# Patient Record
Sex: Female | Born: 1975 | Race: Black or African American | Hispanic: No | Marital: Single | Smoking: Current some day smoker
Health system: Southern US, Community
[De-identification: ages and names within clinical notes are randomized; demographics above are authoritative.]

## PROBLEM LIST (undated history)

## (undated) DIAGNOSIS — A539 Syphilis, unspecified: Secondary | ICD-10-CM

## (undated) DIAGNOSIS — I10 Essential (primary) hypertension: Secondary | ICD-10-CM

## (undated) DIAGNOSIS — F1410 Cocaine abuse, uncomplicated: Secondary | ICD-10-CM

## (undated) DIAGNOSIS — B182 Chronic viral hepatitis C: Secondary | ICD-10-CM

## (undated) HISTORY — PX: APPENDECTOMY: SHX54

## (undated) HISTORY — PX: TUBAL LIGATION: SHX77

## (undated) HISTORY — PX: RECTAL SURGERY: SHX760

---

## 2011-12-15 ENCOUNTER — Emergency Department (HOSPITAL_COMMUNITY): Payer: Medicaid Other | Admitting: Anesthesiology

## 2011-12-15 ENCOUNTER — Emergency Department (INDEPENDENT_AMBULATORY_CARE_PROVIDER_SITE_OTHER): Payer: Medicaid Other

## 2011-12-15 ENCOUNTER — Inpatient Hospital Stay (HOSPITAL_COMMUNITY)
Admission: EM | Admit: 2011-12-15 | Discharge: 2011-12-19 | DRG: 349 | Disposition: A | Discharge status: Home / Self Care | Payer: Medicaid Other | Attending: General Surgery | Admitting: General Surgery

## 2011-12-15 ENCOUNTER — Encounter (HOSPITAL_COMMUNITY): Payer: Self-pay | Admitting: Anesthesiology

## 2011-12-15 ENCOUNTER — Encounter (HOSPITAL_BASED_OUTPATIENT_CLINIC_OR_DEPARTMENT_OTHER): Payer: Self-pay | Admitting: *Deleted

## 2011-12-15 ENCOUNTER — Emergency Department (HOSPITAL_BASED_OUTPATIENT_CLINIC_OR_DEPARTMENT_OTHER)
Admission: EM | Admit: 2011-12-15 | Discharge: 2011-12-15 | Disposition: A | Discharge status: Other Acute Inpatient Hospital | Payer: Medicaid Other | Source: Home / Self Care | Attending: Emergency Medicine | Admitting: Emergency Medicine

## 2011-12-15 ENCOUNTER — Encounter (HOSPITAL_COMMUNITY): Admission: EM | Disposition: A | Discharge status: Home / Self Care | Payer: Self-pay | Source: Home / Self Care

## 2011-12-15 DIAGNOSIS — B182 Chronic viral hepatitis C: Secondary | ICD-10-CM | POA: Insufficient documentation

## 2011-12-15 DIAGNOSIS — Z888 Allergy status to other drugs, medicaments and biological substances status: Secondary | ICD-10-CM

## 2011-12-15 DIAGNOSIS — K611 Rectal abscess: Secondary | ICD-10-CM

## 2011-12-15 DIAGNOSIS — K612 Anorectal abscess: Secondary | ICD-10-CM

## 2011-12-15 DIAGNOSIS — K6289 Other specified diseases of anus and rectum: Secondary | ICD-10-CM

## 2011-12-15 DIAGNOSIS — I10 Essential (primary) hypertension: Secondary | ICD-10-CM | POA: Diagnosis present

## 2011-12-15 HISTORY — PX: EXAMINATION UNDER ANESTHESIA: SHX1540

## 2011-12-15 HISTORY — DX: Essential (primary) hypertension: I10

## 2011-12-15 HISTORY — DX: Chronic viral hepatitis C: B18.2

## 2011-12-15 LAB — CBC
Hemoglobin: 14.5 g/dL (ref 12.0–15.0)
MCH: 34.1 pg — ABNORMAL HIGH (ref 26.0–34.0)
MCHC: 34.9 g/dL (ref 30.0–36.0)
RDW: 13 % (ref 11.5–15.5)

## 2011-12-15 LAB — APTT: aPTT: 29 seconds (ref 24–37)

## 2011-12-15 LAB — COMPREHENSIVE METABOLIC PANEL
Albumin: 3.3 g/dL — ABNORMAL LOW (ref 3.5–5.2)
BUN: 10 mg/dL (ref 6–23)
Creatinine, Ser: 0.7 mg/dL (ref 0.50–1.10)
Potassium: 4 mEq/L (ref 3.5–5.1)
Total Protein: 7.1 g/dL (ref 6.0–8.3)

## 2011-12-15 LAB — DIFFERENTIAL
Basophils Relative: 0 % (ref 0–1)
Eosinophils Absolute: 0.1 10*3/uL (ref 0.0–0.7)
Monocytes Absolute: 1 10*3/uL (ref 0.1–1.0)
Monocytes Relative: 8 % (ref 3–12)
Neutrophils Relative %: 72 % (ref 43–77)

## 2011-12-15 LAB — PROTIME-INR: Prothrombin Time: 12.7 seconds (ref 11.6–15.2)

## 2011-12-15 LAB — PREGNANCY, URINE: Preg Test, Ur: NEGATIVE

## 2011-12-15 SURGERY — EXAM UNDER ANESTHESIA
Anesthesia: General | Site: Rectum | Wound class: Dirty or Infected

## 2011-12-15 MED ORDER — SODIUM CHLORIDE 0.9 % IV SOLN
INTRAVENOUS | Status: DC | PRN
  Administered 2011-12-15: 23:00:00 via INTRAVENOUS

## 2011-12-15 MED ORDER — SUCCINYLCHOLINE CHLORIDE 20 MG/ML IJ SOLN
INTRAMUSCULAR | Status: DC | PRN
  Administered 2011-12-15: 100 mg via INTRAVENOUS

## 2011-12-15 MED ORDER — CIPROFLOXACIN IN D5W 400 MG/200ML IV SOLN
400.0000 mg | Freq: Once | INTRAVENOUS | Status: DC
  Administered 2011-12-15: 400 mg via INTRAVENOUS
  Filled 2011-12-15: qty 200

## 2011-12-15 MED ORDER — BUPIVACAINE HCL (PF) 0.25 % IJ SOLN
INTRAMUSCULAR | Status: AC
  Filled 2011-12-15: qty 30

## 2011-12-15 MED ORDER — IOHEXOL 300 MG/ML  SOLN
100.0000 mL | Freq: Once | INTRAMUSCULAR | Status: AC | PRN
  Administered 2011-12-15: 100 mL via INTRAVENOUS

## 2011-12-15 MED ORDER — SODIUM CHLORIDE 0.9 % IV BOLUS (SEPSIS)
1000.0000 mL | Freq: Once | INTRAVENOUS | Status: AC
  Administered 2011-12-15: 1000 mL via INTRAVENOUS
  Administered 2011-12-15: 5 mL via INTRAVENOUS

## 2011-12-15 MED ORDER — PROPOFOL 10 MG/ML IV EMUL
INTRAVENOUS | Status: DC | PRN
  Administered 2011-12-15: 200 mg via INTRAVENOUS

## 2011-12-15 MED ORDER — LIDOCAINE-EPINEPHRINE 1 %-1:100000 IJ SOLN
INTRAMUSCULAR | Status: AC
  Filled 2011-12-15: qty 1

## 2011-12-15 MED ORDER — LACTATED RINGERS IV SOLN: INTRAVENOUS | Status: DC

## 2011-12-15 MED ORDER — METRONIDAZOLE IN NACL 5-0.79 MG/ML-% IV SOLN
500.0000 mg | Freq: Once | INTRAVENOUS | Status: AC
  Administered 2011-12-15: 500 mg via INTRAVENOUS
  Filled 2011-12-15: qty 100

## 2011-12-15 MED ORDER — ACETAMINOPHEN 500 MG PO TABS
1000.0000 mg | ORAL_TABLET | Freq: Once | ORAL | Status: DC
  Filled 2011-12-15: qty 2

## 2011-12-15 MED ORDER — MEPERIDINE HCL 25 MG/ML IJ SOLN: 6.2500 mg | INTRAMUSCULAR | Status: DC | PRN

## 2011-12-15 MED ORDER — PROMETHAZINE HCL 25 MG/ML IJ SOLN: 6.2500 mg | INTRAMUSCULAR | Status: DC | PRN

## 2011-12-15 MED ORDER — KETOROLAC TROMETHAMINE 30 MG/ML IJ SOLN
30.0000 mg | Freq: Once | INTRAMUSCULAR | Status: AC
  Administered 2011-12-15: 30 mg via INTRAVENOUS
  Filled 2011-12-15: qty 1

## 2011-12-15 SURGICAL SUPPLY — 45 items
BLADE HEX COATED 2.75 (ELECTRODE) ×2 IMPLANT
BLADE SURG 15 STRL LF DISP TIS (BLADE) ×1 IMPLANT
BLADE SURG 15 STRL SS (BLADE) ×1
CANISTER SUCTION 2500CC (MISCELLANEOUS) ×2 IMPLANT
CLOTH BEACON ORANGE TIMEOUT ST (SAFETY) ×2 IMPLANT
DECANTER SPIKE VIAL GLASS SM (MISCELLANEOUS) IMPLANT
DRAIN PENROSE 18X1/4 LTX STRL (WOUND CARE) ×2 IMPLANT
DRAPE LG THREE QUARTER DISP (DRAPES) IMPLANT
DRSG PAD ABDOMINAL 8X10 ST (GAUZE/BANDAGES/DRESSINGS) ×2 IMPLANT
ELECT BLADE TIP CTD 4 INCH (ELECTRODE) ×2 IMPLANT
ELECT REM PT RETURN 9FT ADLT (ELECTROSURGICAL) ×2
ELECTRODE REM PT RTRN 9FT ADLT (ELECTROSURGICAL) ×1 IMPLANT
GAUZE SPONGE 4X4 16PLY XRAY LF (GAUZE/BANDAGES/DRESSINGS) ×2 IMPLANT
GAUZE VASELINE 3X9 (GAUZE/BANDAGES/DRESSINGS) IMPLANT
GLOVE BIO SURGEON STRL SZ7 (GLOVE) ×2 IMPLANT
GLOVE BIOGEL PI IND STRL 7.0 (GLOVE) ×1 IMPLANT
GLOVE BIOGEL PI INDICATOR 7.0 (GLOVE) ×1
GLOVE SURG SS PI 7.5 STRL IVOR (GLOVE) ×4 IMPLANT
GOWN PREVENTION PLUS LG XLONG (DISPOSABLE) ×4 IMPLANT
GOWN STRL NON-REIN LRG LVL3 (GOWN DISPOSABLE) ×2 IMPLANT
GOWN STRL REIN XL XLG (GOWN DISPOSABLE) ×4 IMPLANT
KIT BASIN OR (CUSTOM PROCEDURE TRAY) ×2 IMPLANT
LUBRICANT JELLY K Y 4OZ (MISCELLANEOUS) ×2 IMPLANT
NDL SAFETY ECLIPSE 18X1.5 (NEEDLE) IMPLANT
NEEDLE HYPO 18GX1.5 SHARP (NEEDLE)
NEEDLE HYPO 25X1 1.5 SAFETY (NEEDLE) ×2 IMPLANT
NS IRRIG 1000ML POUR BTL (IV SOLUTION) ×2 IMPLANT
PACK BASIC VI WITH GOWN DISP (CUSTOM PROCEDURE TRAY) ×2 IMPLANT
PACK LITHOTOMY IV (CUSTOM PROCEDURE TRAY) IMPLANT
PENCIL BUTTON HOLSTER BLD 10FT (ELECTRODE) ×2 IMPLANT
SPONGE GAUZE 4X4 12PLY (GAUZE/BANDAGES/DRESSINGS) IMPLANT
SPONGE SURGIFOAM ABS GEL 100 (HEMOSTASIS) ×2 IMPLANT
SPONGE SURGIFOAM ABS GEL 12-7 (HEMOSTASIS) IMPLANT
SUT CHROMIC 2 0 SH (SUTURE) IMPLANT
SUT CHROMIC 3 0 SH 27 (SUTURE) IMPLANT
SUT ETHILON 2 0 PS N (SUTURE) ×2 IMPLANT
SUT MON AB 3-0 SH 27 (SUTURE)
SUT MON AB 3-0 SH27 (SUTURE) IMPLANT
SUT VIC AB 2-0 SH 27 (SUTURE) ×1
SUT VIC AB 2-0 SH 27X BRD (SUTURE) ×1 IMPLANT
SUT VIC AB 4-0 P-3 18XBRD (SUTURE) IMPLANT
SUT VIC AB 4-0 P3 18 (SUTURE)
SYR CONTROL 10ML LL (SYRINGE) ×2 IMPLANT
TOWEL OR 17X26 10 PK STRL BLUE (TOWEL DISPOSABLE) ×2 IMPLANT
YANKAUER SUCT BULB TIP 10FT TU (MISCELLANEOUS) ×2 IMPLANT

## 2011-12-15 NOTE — ED Provider Notes (Addendum)
History   This chart was scribed for Brittany Christen, MD scribed by Magnus Sinning. The patient was seen in room MH01/MH01 seen at 16:30.    CSN: [id number]  Arrival date & time 12/15/11  1611   First MD Initiated Contact with Patient 12/15/11 1631      Chief Complaint  Patient presents with  . Recurrent Skin Infections    (Consider location/radiation/quality/duration/timing/severity/associated sxs/prior treatment) HPI Brittany Duarte is a 36 y.o. female who presents to the Emergency Department complaining of a gradually worsening severe boil located inside her rectum onset 7 days ago. Pt explains that it is very painful and that it is aggravated during BM and flatulence. She describes that it feels like it's "churning inside and that a clear liquid is present after wiping following a BM." Denies fevers, and emesis today, but states that she had one episode of emesis yesterday.  Hx of hemorrhoids at age 62, HTN and Hep C. Reports currently taking hydrochlorothiazide.  No PCP Past Medical History  Diagnosis Date  . Hep C w/o coma, chronic     Past Surgical History  Procedure Date  . Appendectomy   . Tubal ligation     History reviewed. No pertinent family history.  History  Substance Use Topics  . Smoking status: Never Smoker   . Smokeless tobacco: Not on file  . Alcohol Use: No   Review of Systems  Constitutional: Negative.  Negative for fever and chills.  HENT: Negative.   Eyes: Negative.  Negative for discharge and redness.  Respiratory: Negative.  Negative for cough and shortness of breath.   Cardiovascular: Negative.  Negative for chest pain.  Gastrointestinal: Positive for rectal pain. Negative for nausea, vomiting, abdominal pain and diarrhea.  Genitourinary: Negative.  Negative for dysuria and vaginal discharge.  Musculoskeletal: Negative.  Negative for back pain.  Skin: Negative.  Negative for color change and rash.  Neurological: Negative.  Negative for syncope  and headaches.  Hematological: Negative.  Negative for adenopathy.  Psychiatric/Behavioral: Negative.  Negative for confusion.  All other systems reviewed and are negative.    Allergies  Review of patient's allergies indicates not on file.  Home Medications  No current outpatient prescriptions on file.  BP 121/86  Pulse 94  Temp(Src) 98.7 F (37.1 C) (Oral)  Resp 20  Ht 6' (1.829 m)  Wt 244 lb (110.678 kg)  BMI 33.09 kg/m2  SpO2 100%  LMP 12/05/2011  Physical Exam  Nursing note and vitals reviewed. Constitutional: She is oriented to person, place, and time. She appears well-developed and well-nourished. No distress.  HENT:  Head: Normocephalic and atraumatic.  Eyes: EOM are normal. Pupils are equal, round, and reactive to light.  Neck: Neck supple. No tracheal deviation present.  Cardiovascular: Normal rate, regular rhythm and normal heart sounds.  Exam reveals no gallop and no friction rub.   No murmur heard. Pulmonary/Chest: Effort normal and breath sounds normal. No respiratory distress.  Abdominal: Soft. Bowel sounds are normal. She exhibits no distension. There is no tenderness.  Genitourinary:       Chaperone present. Rectal exam shows tenderness superiorly   Musculoskeletal: Normal range of motion. She exhibits no edema.  Neurological: She is alert and oriented to person, place, and time. No sensory deficit.  Skin: Skin is warm and dry.  Psychiatric: She has a normal mood and affect. Her behavior is normal.    ED Course  Procedures (including critical care time) DIAGNOSTIC STUDIES: Oxygen Saturation is 100% on room  air, normal by my interpretation.    COORDINATION OF CARE: Results for orders placed during the hospital encounter of 12/15/11  CBC      Component Value Range   WBC 12.8 (*) 4.0 - 10.5 (K/uL)   RBC 4.25  3.87 - 5.11 (MIL/uL)   Hemoglobin 14.5  12.0 - 15.0 (g/dL)   HCT 16.1  09.6 - 04.5 (%)   MCV 97.6  78.0 - 100.0 (fL)   MCH 34.1 (*) 26.0 -  34.0 (pg)   MCHC 34.9  30.0 - 36.0 (g/dL)   RDW 40.9  81.1 - 91.4 (%)   Platelets 237  150 - 400 (K/uL)  DIFFERENTIAL      Component Value Range   Neutrophils Relative 72  43 - 77 (%)   Neutro Abs 9.2 (*) 1.7 - 7.7 (K/uL)   Lymphocytes Relative 20  12 - 46 (%)   Lymphs Abs 2.6  0.7 - 4.0 (K/uL)   Monocytes Relative 8  3 - 12 (%)   Monocytes Absolute 1.0  0.1 - 1.0 (K/uL)   Eosinophils Relative 1  0 - 5 (%)   Eosinophils Absolute 0.1  0.0 - 0.7 (K/uL)   Basophils Relative 0  0 - 1 (%)   Basophils Absolute 0.0  0.0 - 0.1 (K/uL)  COMPREHENSIVE METABOLIC PANEL      Component Value Range   Sodium 139  135 - 145 (mEq/L)   Potassium 4.0  3.5 - 5.1 (mEq/L)   Chloride 105  96 - 112 (mEq/L)   CO2 25  19 - 32 (mEq/L)   Glucose, Bld 105 (*) 70 - 99 (mg/dL)   BUN 10  6 - 23 (mg/dL)   Creatinine, Ser 7.82  0.50 - 1.10 (mg/dL)   Calcium 9.4  8.4 - 95.6 (mg/dL)   Total Protein 7.1  6.0 - 8.3 (g/dL)   Albumin 3.3 (*) 3.5 - 5.2 (g/dL)   AST 22  0 - 37 (U/L)   ALT 25  0 - 35 (U/L)   Alkaline Phosphatase 87  39 - 117 (U/L)   Total Bilirubin 0.4  0.3 - 1.2 (mg/dL)   GFR calc non Af Amer >90  >90 (mL/min)   GFR calc Af Amer >90  >90 (mL/min)  PREGNANCY, URINE      Component Value Range   Preg Test, Ur NEGATIVE    PROTIME-INR      Component Value Range   Prothrombin Time 12.7  11.6 - 15.2 (seconds)   INR 0.93  0.00 - 1.49   APTT      Component Value Range   aPTT 29  24 - 37 (seconds)    No diagnosis found.    MDM  I personally performed the services described in this documentation, which was scribed in my presence. The recorded information has been reviewed and considered.  Patient has an exam and symptoms concerning for perirectal abscess.  CT scan will be ordered to further evaluate this.  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  2046 Patient has been found to have a  Perirectal abscess on her CT scan and I have discussed these results with her.  I will contact the general surgeon on call at [facility] long for transfer this patient for further management of her abscess.  I will start ciprofloxacin and Flagyl for antibiotic treatment.  Brittany Christen, MD  12/15/11 2047  Patient has been discussed with Dr. Lodema Pilot from general surgery at [facility] and he has accepted the transfer to the over at [facility] for his further evaluation and management.  I also contacted Dr. Clarene Duke the ED attending to make her aware of the patient's pending transfer as well.  Brittany Christen, MD 12/15/11 2059

## 2011-12-15 NOTE — ED Notes (Addendum)
Pt states she has had boils since she was 13. Now has ? Boil near rectum which has been there for about a week. PT IS IN A REHAB PROGRAM. CANNOT HAVE NARCOTICS.

## 2011-12-15 NOTE — H&P (Signed)
Reason for Consult:perirectal abscess Referring Physician: Nayelie Gionfriddo is an 36 y.o. female.  HPI: we were asked to evaluate this patient4 a one-week history of rectal pain and a perirectal abscess found on CT scan. [place]. She has a history of perirectal abscess and has had prior I&D performed and she was age 39. This most recent episode began earlier this week when she began having some rectal pain. She thought this was due to hemorrhoids. She states that she has had pain with gas and with bowel movements and has had some clear drainage which began yesterday. She's also had some vaginal discharge and was diagnosed with bacterial vaginosis admits that her [place]. She denies any blood from the rectum but she has had some chills. She denies any fevers. She also states that her bowels have been loose for the last month. No formal diagnosis of Crohn's disease.  Past Medical History  Diagnosis Date  . Hep C w/o coma, chronic   . Hypertension     Past Surgical History  Procedure Date  . Appendectomy   . Tubal ligation     No family history on file.  Social History:  reports that she has never smoked. She does not have any smokeless tobacco history on file. She reports that she does not drink alcohol or use illicit drugs.  Allergies:  Allergies  Allergen Reactions  . Tylenol (Acetaminophen)     Pt with hx of Hep C     Medications: I have reviewed the patient's current medications.  Results for orders placed during the hospital encounter of 12/15/11 (from the past 48 hour(s))  CBC     Status: Abnormal   Collection Time   12/15/11  4:55 PM      Component Value Range Comment   WBC 12.8 (*) 4.0 - 10.5 (K/uL)    RBC 4.25  3.87 - 5.11 (MIL/uL)    Hemoglobin 14.5  12.0 - 15.0 (g/dL)    HCT 98.1  19.1 - 47.8 (%)    MCV 97.6  78.0 - 100.0 (fL)    MCH 34.1 (*) 26.0 - 34.0 (pg)    MCHC 34.9  30.0 - 36.0 (g/dL)    RDW 29.5   62.1 - 30.8 (%)    Platelets 237  150 - 400 (K/uL)   DIFFERENTIAL     Status: Abnormal   Collection Time   12/15/11  4:55 PM      Component Value Range Comment   Neutrophils Relative 72  43 - 77 (%)    Neutro Abs 9.2 (*) 1.7 - 7.7 (K/uL)    Lymphocytes Relative 20  12 - 46 (%)    Lymphs Abs 2.6  0.7 - 4.0 (K/uL)    Monocytes Relative 8  3 - 12 (%)    Monocytes Absolute 1.0  0.1 - 1.0 (K/uL)    Eosinophils Relative 1  0 - 5 (%)    Eosinophils Absolute 0.1  0.0 - 0.7 (K/uL)    Basophils Relative 0  0 - 1 (%)    Basophils Absolute 0.0  0.0 - 0.1 (K/uL)   COMPREHENSIVE METABOLIC PANEL     Status: Abnormal   Collection Time   12/15/11  4:55 PM      Component Value Range Comment   Sodium 139  135 - 145 (mEq/L)    Potassium 4.0  3.5 - 5.1 (mEq/L)    Chloride 105  96 -  112 (mEq/L)    CO2 25  19 - 32 (mEq/L)    Glucose, Bld 105 (*) 70 - 99 (mg/dL)    BUN 10  6 - 23 (mg/dL)    Creatinine, Ser 0.45  0.50 - 1.10 (mg/dL)    Calcium 9.4  8.4 - 10.5 (mg/dL)    Total Protein 7.1  6.0 - 8.3 (g/dL)    Albumin 3.3 (*) 3.5 - 5.2 (g/dL)    AST 22  0 - 37 (U/L)    ALT 25  0 - 35 (U/L)    Alkaline Phosphatase 87  39 - 117 (U/L)    Total Bilirubin 0.4  0.3 - 1.2 (mg/dL)    GFR calc non Af Amer >90  >90 (mL/min)    GFR calc Af Amer >90  >90 (mL/min)   PROTIME-INR     Status: Normal   Collection Time   12/15/11  4:55 PM      Component Value Range Comment   Prothrombin Time 12.7  11.6 - 15.2 (seconds)    INR 0.93  0.00 - 1.49    APTT     Status: Normal   Collection Time   12/15/11  4:55 PM      Component Value Range Comment   aPTT 29  24 - 37 (seconds)   PREGNANCY, URINE     Status: Normal   Collection Time   12/15/11  6:07 PM      Component Value Range Comment   Preg Test, Ur NEGATIVE       Ct Pelvis W Contrast  12/15/2011  *RADIOLOGY REPORT*  Clinical Data:  History of prior perirectal abscess.  Pain for approximately 1 week.  CT PELVIS WITH CONTRAST  Technique:  Multidetector CT imaging of  the pelvis was performed using the standard protocol following the bolus administration of intravenous contrast.  Contrast: OMNIPAQUE IOHEXOL 300 MG/ML IV SOLN  Comparison:  None.  Findings:  There is a rim enhancing fluid collection immediately anterior to the rectum measuring 3.5 cm cranial-caudal by 2.4 cm AP by 2.0 cm transverse.  Small amount of gas is seen within the collection.  No other fluid collection is identified.  Imaged intrapelvic contents demonstrate postoperative change of appendectomy and tubal ligation.  Urinary bladder is unremarkable. Visualized bowel loops appear normal.  No focal bony abnormality.  IMPRESSION: Perirectal abscess immediately anterior to the rectum as described above.  Original Report Authenticated By: Bernadene Bell. Maricela Curet, M.D.    All other review of systems negative or noncontributory except as stated in the HPI  Blood pressure 161/84, pulse 79, temperature 100.5 F (38.1 C), temperature source Oral, resp. rate 20, last menstrual period 12/05/2011, SpO2 100.00%. General appearance: alert, cooperative and no distress Head: Normocephalic, without obvious abnormality, atraumatic Neck: no JVD and supple, symmetrical, trachea midline Resp: nonlabored, no wheeze or stridor Cardio: normal rate, regular rhythm GI: soft, non-tender; bowel sounds normal; no masses,  no organomegaly Extremities: extremities normal, atraumatic, no cyanosis or edema Skin: Skin color, texture, turgor normal. No rashes or lesions Neurologic: Grossly normal rectal exam very tender and unable to tolerate digital exam, no external lesions noted, no erythema or induration, she has a left sided scar from prior I/D, no hemorrhoids  Assessment/Plan: Perirectal pain and abscess she has a fluid collection on CT scan which is concerning for a perirectal abscess. Given her exquisite tenderness and elevated white count and CT findings, I have offered her rectal exam under anesthesia for possible  diagnosis  and treatment. I explained to her the risks of infection, bleeding, pain, scarring, persistent symptoms and recurrence, negative exam, fistula, incontinence, and she expressed understanding desires to proceed.if the exam is negative, we will continue antibiotics and see how she does with this. I have reviewed the images with the radiologist, and he feels that this is the location which is amenable for drainage by radiology.  Lodema Pilot DAVID 12/15/2011, 10:59 PM

## 2011-12-15 NOTE — Anesthesia Preprocedure Evaluation (Signed)
Anesthesia Evaluation  Patient identified by MRN, date of birth, ID band Patient awake    Reviewed: Allergy & Precautions, H&P , NPO status , Patient's Chart, lab work & pertinent test results  Airway Mallampati: II TM Distance: >3 FB Neck ROM: Full    Dental No notable dental hx.    Pulmonary neg pulmonary ROS,  clear to auscultation  Pulmonary exam normal       Cardiovascular hypertension, Pt. on medications neg cardio ROS Regular Normal    Neuro/Psych Negative Neurological ROS  Negative Psych ROS   GI/Hepatic negative GI ROS, Neg liver ROS, (+) Hepatitis -, C  Endo/Other  Negative Endocrine ROS  Renal/GU negative Renal ROS  Genitourinary negative   Musculoskeletal negative musculoskeletal ROS (+)   Abdominal   Peds negative pediatric ROS (+)  Hematology negative hematology ROS (+)   Anesthesia Other Findings   Reproductive/Obstetrics negative OB ROS                           Anesthesia Physical Anesthesia Plan  ASA: II  Anesthesia Plan: General   Post-op Pain Management:    Induction: Intravenous  Airway Management Planned:   Additional Equipment:   Intra-op Plan:   Post-operative Plan: Extubation in OR  Informed Consent: I have reviewed the patients History and Physical, chart, labs and discussed the procedure including the risks, benefits and alternatives for the proposed anesthesia with the patient or authorized representative who has indicated his/her understanding and acceptance.   Dental advisory given  Plan Discussed with: CRNA  Anesthesia Plan Comments:         Anesthesia Quick Evaluation

## 2011-12-15 NOTE — ED Notes (Signed)
Pt signs consent, is cleaned with CHG wipes, and taken to OR for Dr Biagio Quint.

## 2011-12-15 NOTE — ED Notes (Signed)
[id number]<AV> Expected date:12/15/11<BR> Expected time:10:08 PM<BR> Means of arrival:Ambulance<BR> Comments:<BR> [facility]

## 2011-12-16 ENCOUNTER — Encounter (HOSPITAL_COMMUNITY): Payer: Self-pay | Admitting: General Surgery

## 2011-12-16 MED ORDER — KETOROLAC TROMETHAMINE 30 MG/ML IJ SOLN
INTRAMUSCULAR | Status: AC
  Administered 2011-12-16: 30 mg via INTRAVENOUS
  Filled 2011-12-16: qty 1

## 2011-12-16 MED ORDER — MORPHINE SULFATE 2 MG/ML IJ SOLN: 2.0000 mg | INTRAMUSCULAR | Status: DC | PRN

## 2011-12-16 MED ORDER — CLONIDINE HCL 0.1 MG PO TABS
0.1000 mg | ORAL_TABLET | Freq: Three times a day (TID) | ORAL | Status: DC
  Administered 2011-12-16 – 2011-12-19 (×9): 0.1 mg via ORAL
  Filled 2011-12-16 (×12): qty 1

## 2011-12-16 MED ORDER — LIDOCAINE-EPINEPHRINE 1 %-1:100000 IJ SOLN
INTRAMUSCULAR | Status: DC | PRN
  Administered 2011-12-16: 30 mL

## 2011-12-16 MED ORDER — ENOXAPARIN SODIUM 40 MG/0.4ML ~~LOC~~ SOLN
40.0000 mg | SUBCUTANEOUS | Status: DC
  Administered 2011-12-16 – 2011-12-19 (×4): 40 mg via SUBCUTANEOUS
  Filled 2011-12-16 (×4): qty 0.4

## 2011-12-16 MED ORDER — ONDANSETRON HCL 4 MG/2ML IJ SOLN: 4.0000 mg | Freq: Four times a day (QID) | INTRAMUSCULAR | Status: DC | PRN

## 2011-12-16 MED ORDER — KETOROLAC TROMETHAMINE 30 MG/ML IJ SOLN
30.0000 mg | Freq: Four times a day (QID) | INTRAMUSCULAR | Status: AC | PRN
Start: 2011-12-16 — End: 2011-12-17
  Administered 2011-12-16 (×4): 30 mg via INTRAVENOUS
  Filled 2011-12-16 (×4): qty 1

## 2011-12-16 MED ORDER — ONDANSETRON HCL 4 MG PO TABS: 4.0000 mg | ORAL_TABLET | Freq: Four times a day (QID) | ORAL | Status: DC | PRN

## 2011-12-16 MED ORDER — SODIUM CHLORIDE 0.9 % IV SOLN
3.0000 g | Freq: Four times a day (QID) | INTRAVENOUS | Status: DC
  Administered 2011-12-16 – 2011-12-19 (×13): 3 g via INTRAVENOUS
  Filled 2011-12-16 (×18): qty 3

## 2011-12-16 MED ORDER — 0.9 % SODIUM CHLORIDE (POUR BTL) OPTIME
TOPICAL | Status: DC | PRN
  Administered 2011-12-16: 1000 mL

## 2011-12-16 MED ORDER — TRAMADOL HCL 50 MG PO TABS
50.0000 mg | ORAL_TABLET | Freq: Four times a day (QID) | ORAL | Status: DC | PRN
  Administered 2011-12-16 – 2011-12-18 (×7): 100 mg via ORAL
  Filled 2011-12-16 (×8): qty 2

## 2011-12-16 MED ORDER — BUPIVACAINE-EPINEPHRINE 0.25% -1:200000 IJ SOLN
INTRAMUSCULAR | Status: DC | PRN
  Administered 2011-12-16: 50 mL

## 2011-12-16 NOTE — Progress Notes (Signed)
Report given to [place], RN

## 2011-12-16 NOTE — Progress Notes (Signed)
Patient ID: Brittany Duarte, female   DOB: May 05, 1976, 36 y.o.   MRN: [id number] 1 Day Post-Op  Subjective: Pt says pain controlled with IV pain meds  Objective: Vital signs in last 24 hours: Temp:  [98.3 F (36.8 C)-100.5 F (38.1 C)] 98.6 F (37 C) (01/14 0410) Pulse Rate:  [68-103] 68  (01/14 0410) Resp:  [18-23] 18  (01/14 0410) BP: (117-161)/(75-104) 117/75 mmHg (01/14 0410) SpO2:  [97 %-100 %] 97 % (01/14 0410) Weight:  [244 lb (110.678 kg)] 244 lb (110.678 kg) (01/13 1623) Last BM Date: 12/15/11  Intake/Output from previous day: 01/13 0701 - 01/14 0700 In: 1230 [P.O.:480; I.V.:750] Out: 1150 [Urine:1100; Blood:50] Intake/Output this shift: Total I/O In: -  Out: 750 [Urine:750]  PE: Buttock, penrose in place with no obvious drainage.  Area still quite tender  Lab Results:   Basename 12/15/11 1655  WBC 12.8*  HGB 14.5  HCT 41.5  PLT 237   BMET  Basename 12/15/11 1655  NA 139  K 4.0  CL 105  CO2 25  GLUCOSE 105*  BUN 10  CREATININE 0.70  CALCIUM 9.4   PT/INR  Basename 12/15/11 1655  LABPROT 12.7  INR 0.93     Studies/Results: Ct Pelvis W Contrast  12/15/2011  *RADIOLOGY REPORT*  Clinical Data:  History of prior perirectal abscess.  Pain for approximately 1 week.  CT PELVIS WITH CONTRAST  Technique:  Multidetector CT imaging of the pelvis was performed using the standard protocol following the bolus administration of intravenous contrast.  Contrast: OMNIPAQUE IOHEXOL 300 MG/ML IV SOLN  Comparison:  None.  Findings:  There is a rim enhancing fluid collection immediately anterior to the rectum measuring 3.5 cm cranial-caudal by 2.4 cm AP by 2.0 cm transverse.  Small amount of gas is seen within the collection.  No other fluid collection is identified.  Imaged intrapelvic contents demonstrate postoperative change of appendectomy and tubal ligation.  Urinary bladder is unremarkable. Visualized bowel loops appear normal.  No focal bony abnormality.   IMPRESSION: Perirectal abscess immediately anterior to the rectum as described above.  Original Report Authenticated By: Bernadene Bell. Maricela Curet, M.D.    Anti-infectives: Anti-infectives     Start     Dose/Rate Route Frequency Ordered Stop   12/16/11 0200   Ampicillin-Sulbactam (UNASYN) 3 g in sodium chloride 0.9 % 100 mL IVPB        3 g 100 mL/hr over 60 Minutes Intravenous Every 6 hours 12/16/11 0114             Assessment/Plan  1. Perirectal abscess, s/p I&D and placement of a penrose drain 2. H/o narcotic dependence 3. Hep C  Plan: 1. Will start sitz bathes 2. Will try po ultram.  Pt is a recovering narcotic dependent patient.  This may be limiting factor to discharge, when pain is able to be controlled. 3. Cont abx.     LOS: 1 day    Shawnda Mauney E 12/16/2011

## 2011-12-16 NOTE — Brief Op Note (Signed)
12/15/2011 - 12/16/2011  12:28 AM  PATIENT:  Brittany Duarte  36 y.o. female  PRE-OPERATIVE DIAGNOSIS:  rectal pain  POST-OPERATIVE DIAGNOSIS:  peri rectal abcess  PROCEDURE:  Procedure(s): EXAM UNDER ANESTHESIA  SURGEON:  Surgeon(s): Rulon Abide, DO  PHYSICIAN ASSISTANT:   ASSISTANTS: none   ANESTHESIA:   general  EBL:  Total I/O In: 750 [I.V.:750] Out: 50 [Blood:50]  BLOOD ADMINISTERED:none  DRAINS: Penrose drain in the intersphincteric groove   LOCAL MEDICATIONS USED:  NONE  SPECIMEN:  No Specimen  DISPOSITION OF SPECIMEN:  N/A  COUNTS:  YES  TOURNIQUET:  * No tourniquets in log *  DICTATION: .Other Dictation: Dictation Number [id number]  PLAN OF CARE: Admit to inpatient   PATIENT DISPOSITION:  PACU - hemodynamically stable.   Delay start of Pharmacological VTE agent (>24hrs) due to surgical blood loss or risk of bleeding:  {YES/NO/NOT APPLICABLE:20182

## 2011-12-16 NOTE — Transfer of Care (Signed)
Immediate Anesthesia Transfer of Care Note  Patient: Brittany Duarte  Procedure(s) Performed:  EXAM UNDER ANESTHESIA - incison and drainage of peri rectal abcess  Patient Location: PACU  Anesthesia Type: General  Level of Consciousness: awake, alert  and patient cooperative  Airway & Oxygen Therapy: Patient Spontanous Breathing and Patient connected to face mask oxygen  Post-op Assessment: Report given to PACU RN and Post -op Vital signs reviewed and stable  Post vital signs: Reviewed and stable Filed Vitals:   12/15/11 2239  BP: 161/84  Pulse: 79  Temp: 38.1 C  Resp: 20    Complications: No apparent anesthesia complications

## 2011-12-16 NOTE — Anesthesia Postprocedure Evaluation (Signed)
  Anesthesia Post-op Note  Patient: Brittany Duarte  Procedure(s) Performed:  EXAM UNDER ANESTHESIA - incison and drainage of peri rectal abcess  Patient Location: PACU  Anesthesia Type: General  Level of Consciousness: awake and alert   Airway and Oxygen Therapy: Patient Spontanous Breathing  Post-op Pain: mild  Post-op Assessment: Post-op Vital signs reviewed, Patient's Cardiovascular Status Stable, Respiratory Function Stable, Patent Airway and No signs of Nausea or vomiting  Post-op Vital Signs: stable  Complications: No apparent anesthesia complications

## 2011-12-16 NOTE — Progress Notes (Signed)
I have seen and examined the patient and agree with the assessment and plans.  Saquan Furtick A. Stanlee Roehrig  MD, FACS  

## 2011-12-17 MED ORDER — KETOROLAC TROMETHAMINE 30 MG/ML IJ SOLN
30.0000 mg | Freq: Four times a day (QID) | INTRAMUSCULAR | Status: AC | PRN
  Administered 2011-12-17 – 2011-12-18 (×6): 30 mg via INTRAVENOUS
  Filled 2011-12-17 (×6): qty 1

## 2011-12-17 MED ORDER — POLYETHYLENE GLYCOL 3350 17 G PO PACK
17.0000 g | PACK | Freq: Every day | ORAL | Status: DC
  Administered 2011-12-17 – 2011-12-19 (×3): 17 g via ORAL
  Filled 2011-12-17 (×3): qty 1

## 2011-12-17 MED ORDER — KETOROLAC TROMETHAMINE 10 MG PO TABS
10.0000 mg | ORAL_TABLET | Freq: Four times a day (QID) | ORAL | Status: AC | PRN
  Filled 2011-12-17: qty 1

## 2011-12-17 MED ORDER — KETOROLAC TROMETHAMINE 10 MG PO TABS
30.0000 mg | ORAL_TABLET | Freq: Four times a day (QID) | ORAL | Status: DC | PRN
  Filled 2011-12-17: qty 3

## 2011-12-17 MED ORDER — KETOROLAC TROMETHAMINE 10 MG PO TABS
10.0000 mg | ORAL_TABLET | Freq: Four times a day (QID) | ORAL | Status: DC | PRN
  Filled 2011-12-17: qty 1

## 2011-12-17 MED ORDER — NYSTATIN 100000 UNIT/ML MT SUSP
10.0000 mL | Freq: Four times a day (QID) | OROMUCOSAL | Status: DC
  Filled 2011-12-17 (×11): qty 10

## 2011-12-17 NOTE — Progress Notes (Signed)
I have seen and examined the patient and agree with the assessment and plans.  Brittany Duarte A. Nas Wafer  MD, FACS  

## 2011-12-17 NOTE — Progress Notes (Signed)
Provided patient with medical release form for her to have her lab results.  Brittany Duarte 11:51 AM 12/17/2011

## 2011-12-17 NOTE — Progress Notes (Signed)
Patient ID: Brittany Duarte, female   DOB: [birth year], 36 y.o.   MRN: [id number] 2 Days Post-Op  Subjective: Pt feels ok.  Pain not controlled with po ultram very well currently.  Objective: Vital signs in last 24 hours: Temp:  [98 F (36.7 C)-98.8 F (37.1 C)] 98.2 F (36.8 C) (01/15 0520) Pulse Rate:  [56-64] 56  (01/15 0520) Resp:  [18] 18  (01/15 0520) BP: (122-142)/(76-88) 133/88 mmHg (01/15 0520) SpO2:  [97 %-98 %] 98 % (01/15 0520) Last BM Date: 12/15/11  Intake/Output from previous day: 01/14 0701 - 01/15 0700 In: 880 [P.O.:880] Out: 1320 [Urine:1320] Intake/Output this shift:    PE: Buttock: no drainage. Penrose in place.  Still tender  Lab Results:   Basename 12/15/11 1655  WBC 12.8*  HGB 14.5  HCT 41.5  PLT 237   BMET  Basename 12/15/11 1655  NA 139  K 4.0  CL 105  CO2 25  GLUCOSE 105*  BUN 10  CREATININE 0.70  CALCIUM 9.4   PT/INR  Basename 12/15/11 1655  LABPROT 12.7  INR 0.93     Studies/Results: Ct Pelvis W Contrast  12/15/2011  *RADIOLOGY REPORT*  Clinical Data:  History of prior perirectal abscess.  Pain for approximately 1 week.  CT PELVIS WITH CONTRAST  Technique:  Multidetector CT imaging of the pelvis was performed using the standard protocol following the bolus administration of intravenous contrast.  Contrast: OMNIPAQUE IOHEXOL 300 MG/ML IV SOLN  Comparison:  None.  Findings:  There is a rim enhancing fluid collection immediately anterior to the rectum measuring 3.5 cm cranial-caudal by 2.4 cm AP by 2.0 cm transverse.  Small amount of gas is seen within the collection.  No other fluid collection is identified.  Imaged intrapelvic contents demonstrate postoperative change of appendectomy and tubal ligation.  Urinary bladder is unremarkable. Visualized bowel loops appear normal.  No focal bony abnormality.  IMPRESSION: Perirectal abscess immediately anterior to the rectum as described above.  Original Report Authenticated By: Bernadene Bell.  Maricela Curet, M.D.    Anti-infectives: Anti-infectives     Start     Dose/Rate Route Frequency Ordered Stop   12/16/11 0200   Ampicillin-Sulbactam (UNASYN) 3 g in sodium chloride 0.9 % 100 mL IVPB        3 g 100 mL/hr over 60 Minutes Intravenous Every 6 hours 12/16/11 0114             Assessment/Plan  1. Perirectal abscess, s/p I&D 2. Recovering narcotic addict  Plan: 1. Continue with penrose and sitz bathes 2. Will have to keep patient for now until pain controlled as there are limited pain meds we can send her home with due to her recovery status.     LOS: 2 days    Akshay Spang E 12/17/2011

## 2011-12-17 NOTE — Op Note (Signed)
NAMERUBYE, STROHMEYER NO.:  [id number]  MEDICAL RECORD NO.:  [id number]  LOCATION:  1618                         FACILITY:  [facility]  PHYSICIAN:  Lodema Pilot, MD       DATE OF BIRTH:  Jan 08, 1976  DATE OF PROCEDURE:  12/15/11 DATE OF DISCHARGE:                              OPERATIVE REPORT   PROCEDURE:  Rectal exam under anesthesia with drainage of perirectal abscess.  PREOPERATIVE DIAGNOSIS:  Perirectal abscess.  POSTOPERATIVE DIAGNOSIS:  Perirectal abscess.  SURGEON:  Lodema Pilot, MD  ASSISTANT:  None.  ANESTHESIA:  General endotracheal anesthesia with 10 cc of 1% lidocaine with epinephrine, 0.25% Marcaine in a 50:50 mixture.  FLUIDS:  750 cc of crystalloid.  ESTIMATED BLOOD LOSS:  Minimal.  DRAINS:  Quarter-inch Penrose drain was placed into the abscess cavity around the internal sphincter muscle in the intersphincteric fashion.  COMPLICATIONS:  None apparent.  INDICATION OF PROCEDURE:  Ms. Spurling is a 36 year old female with a week- long history of perirectal pain, which has been increasing and had clear drainage from her rectum.  She had a CT scan, which was consistent with perirectal abscess.  OPERATIVE DETAILS:  Ms. Linebaugh was seen and evaluated in the emergency room and risks and benefits of this procedure were discussed in lay terms.  Informed consent was obtained.  She was already on therapeutic antibiotics by the emergency room.  She was taken to the operating room, and general endotracheal anesthesia was obtained.  She was then placed in the prone jack-knife position and her buttocks were taped laterally. There was no evidence of any external problems, showed no hemorrhoids and she had a scar from prior I and D, but no induration or erythema externally.  Her buttocks were prepped and draped in a standard surgical fashion and anal retractor was placed, and we immediately noted a significant amount of tar-smelling white purulence.  The  area of the rectum was suctioned and irrigated and were able to identify the source of the purulence in the immediately anterior position just cephalad to the internal sphincter muscle.  She had a purulent material bubbling up from this area and I put a hemostat into the area where the purulence was coming and just dilated up this area and was able to place Yankauer suction into this cavity, extended slightly cephalad and also caudad. After we suctioned all the pus from the cavity and irrigated the wound, she was noted to have another opening just distal to the internal sphincter muscle.  Hemostat was placed into this and without forcing the hemostats, the tips were seen in her cephalad opening, consistent with intersphincteric abscess.  I decided that the best way for drainage of this would be, to place 0.25 inch Penrose drain to each opening and this will allow to drain until the sepsis had resolved, and the drain was sutured to itself externally, forming a large loop and the plan was to be able to remove this in 2-3 days after the sepsis had resolved by cutting the Penrose drain and being able to remove that externally.  We attempted to anesthetize the area with 2 cc of 1% lidocaine with epinephrine  and 0.25% Marcaine in a 50:50 mixture because the inflammation only caused bleeding, so minimal amount of local was injected, but I decided to sock the Gelfoam pad in the mixture and this was packed into the anal cavity for hemostasis and for pain control, and ABD was placed.  All sponge, needle, and instrument counts were correct at the end of the case, except for the Penrose drain, which was left intentionally through the wound for drainage.  She was stable and ready for transfer to the recovery room in stable condition.          ______________________________ Lodema Pilot, MD     BL/MEDQ  D:  12/16/2011  T:  12/16/2011  Job:  [id number]

## 2011-12-18 MED ORDER — CLONIDINE HCL 0.1 MG PO TABS
0.1000 mg | ORAL_TABLET | Freq: Once | ORAL | Status: AC
  Administered 2011-12-19: 0.1 mg via ORAL
  Filled 2011-12-18: qty 1

## 2011-12-18 MED ORDER — DOCUSATE SODIUM 100 MG PO CAPS
100.0000 mg | ORAL_CAPSULE | Freq: Two times a day (BID) | ORAL | Status: DC
  Administered 2011-12-18 – 2011-12-19 (×3): 100 mg via ORAL
  Filled 2011-12-18 (×4): qty 1

## 2011-12-18 MED ORDER — VITAMINS A & D EX OINT
TOPICAL_OINTMENT | CUTANEOUS | Status: AC
  Administered 2011-12-18: 5
  Filled 2011-12-18: qty 5

## 2011-12-18 NOTE — Progress Notes (Signed)
I have seen and examined the patient and agree with the assessment and plans.  Rebeca Valdivia A. Aaronmichael Brumbaugh  MD, FACS  

## 2011-12-18 NOTE — Progress Notes (Signed)
Patient ID: Brittany Duarte, female   DOB: 1976/01/06, 36 y.o.   MRN: [id number] 3 Days Post-Op  Subjective: Pt feels ok.  Still with pain that is only improved with morphine.  Objective: Vital signs in last 24 hours: Temp:  [97.9 F (36.6 C)-98.6 F (37 C)] 97.9 F (36.6 C) (01/16 0500) Pulse Rate:  [52-65] 65  (01/16 0500) Resp:  [12-18] 12  (01/16 0500) BP: (145-154)/(86-108) 149/96 mmHg (01/16 0500) SpO2:  [98 %-99 %] 99 % (01/16 0500) Weight:  [244 lb (110.678 kg)] 244 lb (110.678 kg) (01/15 0850) Last BM Date: 12/17/11  Intake/Output from previous day: 01/15 0701 - 01/16 0700 In: 1000 [P.O.:600; IV Piggyback:400] Out: -  Intake/Output this shift:    PE: Buttock: no evidence of any further infection.  Penrose pulled today.  Lab Results:   [facility] 12/15/11 1655  WBC 12.8*  HGB 14.5  HCT 41.5  PLT 237   BMET  Basename 12/15/11 1655  NA 139  K 4.0  CL 105  CO2 25  GLUCOSE 105*  BUN 10  CREATININE 0.70  CALCIUM 9.4   PT/INR  Basename 12/15/11 1655  LABPROT 12.7  INR 0.93     Studies/Results: No results found.  Anti-infectives: Anti-infectives     Start     Dose/Rate Route Frequency Ordered Stop   12/16/11 0200   Ampicillin-Sulbactam (UNASYN) 3 g in sodium chloride 0.9 % 100 mL IVPB        3 g 100 mL/hr over 60 Minutes Intravenous Every 6 hours 12/16/11 0114             Assessment/Plan  1. S/p I&D of perirectal abscess 2. Recovering narcotic dependence  Plan: 1. Penrose pulled today.  Hopefully this will help with some of her pain control 2. Cont sitz bathes; hopefully home in the next day or two.   LOS: 3 days    Jennie Hannay E 12/18/2011

## 2011-12-19 MED ORDER — AMOXICILLIN-POT CLAVULANATE 875-125 MG PO TABS: 1.0000 | ORAL_TABLET | Freq: Two times a day (BID) | ORAL | Status: DC

## 2011-12-19 MED ORDER — KETOROLAC TROMETHAMINE 30 MG/ML IJ SOLN
30.0000 mg | Freq: Four times a day (QID) | INTRAMUSCULAR | Status: DC | PRN
  Administered 2011-12-19: 30 mg via INTRAVENOUS
  Filled 2011-12-19: qty 1

## 2011-12-19 MED ORDER — AMOXICILLIN-POT CLAVULANATE 875-125 MG PO TABS
1.0000 | ORAL_TABLET | Freq: Two times a day (BID) | ORAL | Status: DC
Start: 2011-12-19 — End: 2011-12-19
  Filled 2011-12-19 (×2): qty 1

## 2011-12-19 MED ORDER — VITAMINS A & D EX OINT
TOPICAL_OINTMENT | CUTANEOUS | Status: AC
  Filled 2011-12-19: qty 10

## 2011-12-19 MED ORDER — AMOXICILLIN-POT CLAVULANATE 875-125 MG PO TABS: 1.0000 | ORAL_TABLET | Freq: Two times a day (BID) | ORAL | Status: AC

## 2011-12-19 MED ORDER — TRAMADOL HCL 50 MG PO TABS: 50.0000 mg | ORAL_TABLET | Freq: Four times a day (QID) | ORAL | Status: DC | PRN

## 2011-12-19 MED ORDER — TRAMADOL HCL 50 MG PO TABS: 50.0000 mg | ORAL_TABLET | Freq: Four times a day (QID) | ORAL | Status: AC | PRN

## 2011-12-19 NOTE — Discharge Summary (Signed)
Patient ID: Brittany Duarte MRN: [id number] DOB/AGE: 07-Jul-1976 35 y.o.  Admit date: 12/15/2011 Discharge date: 12/19/2011  Procedures:  Incision and Drainage of perirectal abscess with placement of a penrose drain  Consults: none  Reason for Admission:  This is a 36 yo female who presented to the [facility] with a complaint of perirectal pain. She was found to have a perirectal abscess and we were asked to evaluate her for possible I&D.  Admission Diagnoses: 1. Perirectal abscess 2. HTN  Hospital Course: The patient was admitted and taken to the operating room where she had an I&D of a perirectal abscess.  A penrose drain was left in place.  She was started on sitz bathes on POD # 1.  The patient is a recovering narcotic dependent patient and therefore we could only start her on ultram and IV morphine for pain control.  This prevented her from being able to be discharged within the first couple of days.  She was kept for 4 days for pain control.  Her penrose drain was discontinued on POD# 3 which helped her pain control.  She was otherwise stable with good pain control by POD# 4.  She did have some HTN.  She was kept on her clonidine while here.  She is informed to follow up with a PCP physician as an outpatient for further management.  Discharge Diagnoses:  1. Perirectal abscess, s/p I&D 2. Recovering narcotic addict 3. HTN  Discharge Medications: Current Discharge Medication List    START taking these medications   Details  amoxicillin-clavulanate (AUGMENTIN) 875-125 MG per tablet Take 1 tablet by mouth every 12 (twelve) hours. Qty: 10 tablet, Refills: 0    traMADol (ULTRAM) 50 MG tablet Take 1-2 tablets (50-100 mg total) by mouth every 6 (six) hours as needed. Qty: 40 tablet, Refills: 0      CONTINUE these medications which have NOT CHANGED   Details  Benzocaine ([employer] BOIL RELIEF) 20 % OINT Apply 1 application topically 3 (three) times daily as needed. For pain    cloNIDine (CATAPRES)  0.1 MG tablet Take 0.1 mg by mouth 3 (three) times daily.    hydrocortisone cream 1 % Apply 1 application topically 2 (two) times daily. For rash    ibuprofen (ADVIL,MOTRIN) 200 MG tablet Take 400 mg by mouth 2 (two) times daily as needed. For pain        Discharge Instructions:  Follow-up Information    Follow up with [facility] on 12/31/2011. (2:10pm, arrive at 1:50pm)    Contact information:   [phone]       Obtain primary care physician for management of high blood pressure.  Signed: Lakin Rhine E 12/19/2011, 7:45 AM

## 2011-12-19 NOTE — Progress Notes (Signed)
Patient hypertensive, MD on call Dr. Johna Sheriff notified, new orders received.  Will continue to monitor.

## 2011-12-31 ENCOUNTER — Encounter (INDEPENDENT_AMBULATORY_CARE_PROVIDER_SITE_OTHER): Payer: Self-pay | Admitting: General Surgery

## 2011-12-31 ENCOUNTER — Ambulatory Visit (INDEPENDENT_AMBULATORY_CARE_PROVIDER_SITE_OTHER): Payer: Medicaid Other | Admitting: General Surgery

## 2011-12-31 VITALS — BP 132/86 | Ht 72.0 in | Wt 240.0 lb

## 2011-12-31 DIAGNOSIS — B373 Candidiasis of vulva and vagina: Secondary | ICD-10-CM

## 2011-12-31 DIAGNOSIS — Z9889 Other specified postprocedural states: Secondary | ICD-10-CM

## 2011-12-31 DIAGNOSIS — K611 Rectal abscess: Secondary | ICD-10-CM

## 2011-12-31 DIAGNOSIS — K612 Anorectal abscess: Secondary | ICD-10-CM

## 2011-12-31 MED ORDER — FLUCONAZOLE 100 MG PO TABS: 100.0000 mg | ORAL_TABLET | Freq: Every day | ORAL | Status: AC

## 2011-12-31 NOTE — Patient Instructions (Addendum)
Follow up with primary or GYN doctor if vaginal discomfort does not improve.

## 2011-12-31 NOTE — Progress Notes (Signed)
Azhia Siefken July 17, 1976  HPI: Ms. Puller presents today for a post-op check following an I&D of a perirectal abscess.  She is doing well, with the exception of some vaginal discomfort and itching.  She also complains of some creamy white discharge.  She has been on abx therapy for this perirectal abscess.  PE: Buttock: complete resolution and healing of her perirectal abscess.  Imp/Plan: 1. Perirectal abscess, s/p I&D 2. Vaginal candida  The patient is released from our practice as she has complete resolution of her abscess.  I will give her a prescription for Diflucan, but I have informed her that if this does not resolve she will need to see a PCP or GYN.

## 2012-02-10 ENCOUNTER — Emergency Department (HOSPITAL_BASED_OUTPATIENT_CLINIC_OR_DEPARTMENT_OTHER)
Admission: EM | Admit: 2012-02-10 | Discharge: 2012-02-10 | Disposition: A | Discharge status: Home / Self Care | Payer: Self-pay | Attending: Emergency Medicine | Admitting: Emergency Medicine

## 2012-02-10 ENCOUNTER — Emergency Department (INDEPENDENT_AMBULATORY_CARE_PROVIDER_SITE_OTHER): Payer: Self-pay

## 2012-02-10 ENCOUNTER — Encounter (HOSPITAL_BASED_OUTPATIENT_CLINIC_OR_DEPARTMENT_OTHER): Payer: Self-pay

## 2012-02-10 ENCOUNTER — Other Ambulatory Visit: Payer: Self-pay

## 2012-02-10 DIAGNOSIS — R911 Solitary pulmonary nodule: Secondary | ICD-10-CM

## 2012-02-10 DIAGNOSIS — Z79899 Other long term (current) drug therapy: Secondary | ICD-10-CM | POA: Insufficient documentation

## 2012-02-10 DIAGNOSIS — R21 Rash and other nonspecific skin eruption: Secondary | ICD-10-CM | POA: Insufficient documentation

## 2012-02-10 DIAGNOSIS — I10 Essential (primary) hypertension: Secondary | ICD-10-CM | POA: Insufficient documentation

## 2012-02-10 DIAGNOSIS — R079 Chest pain, unspecified: Secondary | ICD-10-CM | POA: Insufficient documentation

## 2012-02-10 DIAGNOSIS — Z8611 Personal history of tuberculosis: Secondary | ICD-10-CM | POA: Insufficient documentation

## 2012-02-10 DIAGNOSIS — L2989 Other pruritus: Secondary | ICD-10-CM | POA: Insufficient documentation

## 2012-02-10 DIAGNOSIS — Z8619 Personal history of other infectious and parasitic diseases: Secondary | ICD-10-CM | POA: Insufficient documentation

## 2012-02-10 DIAGNOSIS — L298 Other pruritus: Secondary | ICD-10-CM | POA: Insufficient documentation

## 2012-02-10 DIAGNOSIS — M79609 Pain in unspecified limb: Secondary | ICD-10-CM | POA: Insufficient documentation

## 2012-02-10 NOTE — ED Notes (Signed)
ekg done and reviewed by EDP Zacowski-no orders at present

## 2012-02-10 NOTE — ED Provider Notes (Signed)
This chart was scribed for Brittany Gaskins, MD by Wallis Mart. The patient was seen in room MH07/MH07 and the patient's care was started at 4:13 PM.   CSN: [id number]  Arrival date & time 02/10/12  1439   First MD Initiated Contact with Patient 02/10/12 1544      Chief Complaint  Patient presents with  . Chest Pain     HPI  Pt seen at 4:13 PM Crystelle Ferrufino is a 36 y.o. female who presents to the Emergency Department complaining of onset left sided, intermittent, gradually worsening chest pain onset several days ago.  The pain radiates nowhere. Pt describes the pain as a sharp shooting pain that shoots through her breast for a split second.  Breathing or exertion do not worsen the pain and nothing triggers the pain. Pt denies any cp currently. Pt had TB 10 years ago and is worried that she might have it again.  Pt states that she has been breaking out in itchy rashes for 2 months .  Has been using hydrocortisone with no relief. Pt c/o burning pains in her legs.    Pt denies passing out, sweating while experiencing cp. Pt denies coughing up blood, denies h/o MI/PE/DVT. There are no other associated symptoms and no other alleviating or aggravating factors.   Pt had a rectal cyst in January which was removed.  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 2012.  Pt w/ h/o Hep C.  Past Medical History  Diagnosis Date  . Hep C w/o coma, chronic   . Hypertension   . Tuberculosis of lung, nodular     Past Surgical History  Procedure Date  . Appendectomy   . Tubal ligation   . Examination under anesthesia 12/15/2011    Procedure: EXAM UNDER ANESTHESIA;  Surgeon: Rulon Abide, DO;  Location: [facility] ORS;  Service: General;  Laterality: N/A;  incison and drainage of peri rectal abcess  . Rectal surgery     No family history on file.  History  Substance Use Topics  . Smoking status: Current Everyday Smoker  . Smokeless tobacco: Not on file  . Alcohol Use: No    OB History      Grav Para Term Preterm Abortions TAB SAB Ect Mult Living                  Review of Systems  Constitutional: Negative for fever.  HENT: Negative for rhinorrhea.   Eyes: Negative for pain.  Respiratory: Negative for cough and shortness of breath.   Cardiovascular: Positive for chest pain.  Gastrointestinal: Negative for nausea, vomiting, abdominal pain and diarrhea.  Genitourinary: Negative for dysuria.  Musculoskeletal: Negative for back pain.  Skin: Positive for rash.  Neurological: Negative for weakness and headaches.  All other systems reviewed and are negative.    Allergies  Tylenol  Home Medications   Current Outpatient Rx  Name Route Sig Dispense Refill  . CLONIDINE HCL 0.1 MG PO TABS Oral Take 0.1 mg by mouth 3 (three) times daily.    Marland Kitchen HYDROCORTISONE 1 % EX CREA Topical Apply 1 application topically 2 (two) times daily. For rash    . IBUPROFEN 200 MG PO TABS Oral Take 400 mg by mouth 2 (two) times daily as needed. For pain    . MELATONIN PO Oral Take by mouth.    . BENZOCAINE 20 % EX OINT Apply externally Apply 1 application topically 3 (three) times daily as needed. For pain  BP 122/85  Pulse 74  Temp(Src) 98.3 F (36.8 C) (Oral)  Resp 16  Ht 6' (1.829 m)  Wt 247 lb (112.038 kg)  BMI 33.50 kg/m2  SpO2 100%  LMP 02/06/2012  Physical Exam CONSTITUTIONAL: Well developed/well nourished HEAD AND FACE: Normocephalic/atraumatic EYES: EOMI/PERRL ENMT: Mucous membranes moist NECK: supple no meningeal signs SPINE:entire spine nontender CV: S1/S2 noted, no murmurs/rubs/gallops noted LUNGS: Lungs are clear to auscultation bilaterally, no apparent distress ABDOMEN: soft, nontender, no rebound or guarding GU:no cva tenderness NEURO: Pt is awake/alert, moves all extremitiesx4 EXTREMITIES: pulses normal, full ROM SKIN: warm, color normal, no petechaie, scattered well healed scabs to left UE and back.  No crepitance/drainage/erythema PSYCH: no abnormalities  of mood noted   ED Course  Procedures  DIAGNOSTIC STUDIES: Oxygen Saturation is 100% on room air, normal by my interpretation.    COORDINATION OF CARE:    Labs Reviewed - No data to display Dg Chest 2 View  02/10/2012  *RADIOLOGY REPORT*  Clinical Data: Chest rash.  CHEST - 2 VIEW  Comparison: None.  Findings: No evidence for focal airspace consolidation, pulmonary edema, or pleural effusion.  5 mm nodule is seen in the left upper lobe.  The conspicuity relative to its small size suggests that it is mineralized. The cardiopericardial silhouette is within normal limits for size. Imaged bony structures of the thorax are intact.  IMPRESSION: Left upper lobe nodule.  While this is likely a granuloma, confirmation is recommended.  If the patient has previous imaging, this would be helpful to determine the chronicity of this finding. Alternatively, CT chest without contrast could be used to further evaluate.  Original Report Authenticated By: ERIC A. MANSELL, M.D.    4:33 PM I d/w radiology concerning xray and given distant h/o TB, likely related to that (granuloma) pt has no signs of active TB at this time Pt made aware of this and need for f/u and may need repeating imaging within the year Rash not c/w scabies Chest pain unlikely ACS/PE/Dissection She has no h/o consistent with active TB  The patient appears reasonably screened and/or stabilized for discharge and I doubt any other medical condition or other [facility] requiring further screening, evaluation, or treatment in the ED at this time prior to discharge.   MDM  Nursing notes reviewed and considered in documentation xrays reviewed and considered    Date: 02/10/2012  Rate: 63  Rhythm: normal sinus rhythm  QRS Axis: normal  Intervals: normal  ST/T Wave abnormalities: normal  Conduction Disutrbances:none       I personally performed the services described in this documentation, which was scribed in my presence. The recorded  information has been reviewed and considered.        Brittany Gaskins, MD 02/10/12 [phone]

## 2012-02-10 NOTE — ED Notes (Signed)
When taking pt from triage room 1 to tx room 7 she states she is concerned for active TB reoccur (was tx in 199) due to rash, pt placed on resp isolation with N95 mask and isolation sign on door

## 2012-02-10 NOTE — ED Notes (Signed)
Left side CP x 4 days-central and right side CP today

## 2012-02-10 NOTE — Discharge Instructions (Signed)
Your caregiver has diagnosed you as having chest pain that is not specific for one problem, but does not require admission.  Chest pain comes from many different causes.  SEEK IMMEDIATE MEDICAL ATTENTION IF: You have severe chest pain, especially if the pain is crushing or pressure-like and spreads to the arms, back, neck, or jaw, or if you have sweating, nausea (feeling sick to your stomach), or shortness of breath. THIS IS AN EMERGENCY. Don't wait to see if the pain will go away. Get medical help at once. Call 911 or 0 (operator). DO NOT drive yourself to the hospital.  Your chest pain gets worse and does not go away with rest.  You have an attack of chest pain lasting longer than usual, despite rest and treatment with the medications your caregiver has prescribed.  You wake from sleep with chest pain or shortness of breath.  You feel dizzy or faint.  You have chest pain not typical of your usual pain for which you originally saw your caregiver.   RESOURCE GUIDE  Dental Problems  Patients with Medicaid: [facility]                     [facility] [address]                                           [address] Phone:  [phone]                                                  Phone:  [phone]  If unable to pay or uninsured, contact:  [facility] or [facility] to become qualified for the adult dental clinic.  Chronic Pain Problems Contact  Chronic [facility] Clinic  [phone] Patients need to be referred by their primary care doctor.  Insufficient Money for Medicine Contact [employer]:  call "211" or [facility] 271-5999.  No Primary Care Doctor Call [facility]  832-8000 Other agencies that provide inexpensive medical care    [facility]  832-8035    [facility]  832-7272    [facility]  [phone]    Women's Clinic  832-4777    [facility]  [phone]  [facility]  272-1050  Psychological Services [facility]  832-9600 [facility]  [phone] [facility]   800 853-5163 (emergency services 641-4993)  Substance Abuse Resources [facility]  336-882-2125 [facility] 336-784-9470 [facility] 336-285-9073 [facility] 336-845-3988 Residential & Outpatient Substance Abuse Program  800-659-3381  Abuse/Neglect [place] Child Abuse Hotline (336) 641-3795 [place] Child Abuse Hotline 800-378-5315 (After Hours)  Emergency Shelter [facility] (336) 271-5985  M[facility] (336) 275-9566 [facility] (704) 372-4663  MRSA Hotline #:   832-7006    [place] Resources  [facility] [place]     [employer]                          [facility] [address] [place]                         [address]      [address]  [place]                                                [place]                            [place] Phone:  [phone]                                   Phone:  [phone]                 Phone:  [phone]  [facility] Phone:  [phone]  [place] Child Abuse Hotline (336) 342-1394 (336) 342-3537 (After Hours)    

## 2012-02-10 NOTE — ED Notes (Signed)
Pt currently in recovery for crack abuse x 2 months-no use x 3 months

## 2012-03-27 ENCOUNTER — Encounter (HOSPITAL_COMMUNITY): Payer: Self-pay | Admitting: *Deleted

## 2012-03-27 ENCOUNTER — Emergency Department (HOSPITAL_COMMUNITY): Payer: Self-pay

## 2012-03-27 ENCOUNTER — Emergency Department (HOSPITAL_COMMUNITY)
Admission: EM | Admit: 2012-03-27 | Discharge: 2012-03-27 | Disposition: A | Discharge status: Home Health | Payer: Self-pay | Attending: Emergency Medicine | Admitting: Emergency Medicine

## 2012-03-27 DIAGNOSIS — R209 Unspecified disturbances of skin sensation: Secondary | ICD-10-CM | POA: Insufficient documentation

## 2012-03-27 DIAGNOSIS — Z79899 Other long term (current) drug therapy: Secondary | ICD-10-CM | POA: Insufficient documentation

## 2012-03-27 DIAGNOSIS — Z8611 Personal history of tuberculosis: Secondary | ICD-10-CM | POA: Insufficient documentation

## 2012-03-27 DIAGNOSIS — Z8619 Personal history of other infectious and parasitic diseases: Secondary | ICD-10-CM | POA: Insufficient documentation

## 2012-03-27 DIAGNOSIS — I10 Essential (primary) hypertension: Secondary | ICD-10-CM | POA: Insufficient documentation

## 2012-03-27 DIAGNOSIS — R079 Chest pain, unspecified: Secondary | ICD-10-CM | POA: Insufficient documentation

## 2012-03-27 LAB — BASIC METABOLIC PANEL
BUN: 9 mg/dL (ref 6–23)
GFR calc Af Amer: >90 mL/min (ref >90)
GFR calc non Af Amer: >90 mL/min (ref >90)
Potassium: 4.1 mEq/L (ref 3.5–5.1)
Sodium: 140 mEq/L (ref 135–145)

## 2012-03-27 LAB — POCT I-STAT TROPONIN I: Troponin i, poc: 0 ng/mL (ref 0.00–0.08)

## 2012-03-27 LAB — CBC
MCH: 33.7 pg (ref 26.0–34.0)
MCHC: 34.2 g/dL (ref 30.0–36.0)
Platelets: 279 10*3/uL (ref 150–400)
RDW: 12.7 % (ref 11.5–15.5)

## 2012-03-27 LAB — DIFFERENTIAL
Basophils Absolute: 0 10*3/uL (ref 0.0–0.1)
Basophils Relative: 0 % (ref 0–1)
Eosinophils Absolute: 0.1 10*3/uL (ref 0.0–0.7)
Monocytes Relative: 6 % (ref 3–12)
Neutrophils Relative %: 59 % (ref 43–77)

## 2012-03-27 NOTE — ED Notes (Signed)
Pt reports 3 week hx of intermittent sharp pains to bilateral breasts. Pt states she will have a sharp pain to her chest that will come and radiated around chest and then go away, states lasts only a couple seconds. Pt denies any associated symptoms. Pt states she also has had numbness to her back, points to a specific area, denies any injury.

## 2012-03-27 NOTE — ED Provider Notes (Signed)
History     CSN: [id number]  Arrival date & time 03/27/12  0915   First MD Initiated Contact with Patient 03/27/12 [id number]      Chief Complaint  Patient presents with  . Chest Pain    (Consider location/radiation/quality/duration/timing/severity/associated sxs/prior treatment) HPI Comments: Patient comes in today with a chief complaint of chest pain.  She reports that she has been having this pain intermittently over the past month.  The pain comes 1-3 times daily.  She reports that the pain typically lasts a few seconds and then resolves.  She describes the pain as a "lightening bolt" traveling to both breast.  She also complains of occasional chest pressure located just inferior to the left breast.  Pain is not associated with any SOB, nausea, vomiting, or diaphoresis.  No prior cardiac history.  She reports that she was diagnosed with TB back in 2000 and was on medication for this for 6 months.  She denies any fever, cough, or night sweats at this time.  She is also complaining of some numbness of her upper back.  Patient does smoke cigarettes daily.  No prior history of DVT/PE, patient not on estrogen containing medications, no swelling of LE, no travel or surgery in the past 4 weeks.  Patient is a 36 y.o. female presenting with chest pain. The history is provided by the patient.  Chest Pain Pertinent negatives for primary symptoms include no fever, no shortness of breath, no cough, no wheezing, no nausea, no vomiting and no dizziness.  Associated symptoms include numbness.     Past Medical History  Diagnosis Date  . Hep C w/o coma, chronic   . Hypertension   . Tuberculosis of lung, nodular     Past Surgical History  Procedure Date  . Appendectomy   . Tubal ligation   . Examination under anesthesia 12/15/2011    Procedure: EXAM UNDER ANESTHESIA;  Surgeon: Rulon Abide, DO;  Location: [facility];  Service: General;  Laterality: N/A;  incison and drainage of peri rectal abcess    . Rectal surgery     History reviewed. No pertinent family history.  History  Substance Use Topics  . Smoking status: Current Everyday Smoker  . Smokeless tobacco: Not on file  . Alcohol Use: No    OB History    Grav Para Term Preterm Abortions TAB SAB Ect Mult Living                  Review of Systems  Constitutional: Negative for fever and chills.  HENT: Negative for neck pain and neck stiffness.   Respiratory: Negative for cough, shortness of breath and wheezing.   Cardiovascular: Positive for chest pain.  Gastrointestinal: Negative for nausea and vomiting.  Skin: Negative for rash.  Neurological: Positive for numbness. Negative for dizziness, syncope, facial asymmetry, light-headedness and headaches.    Allergies  Tylenol  Home Medications   Current Outpatient Rx  Name Route Sig Dispense Refill  . CLONIDINE HCL 0.1 MG PO TABS Oral Take 0.1 mg by mouth 2 (two) times daily.     . IBUPROFEN 200 MG PO TABS Oral Take 400 mg by mouth 2 (two) times daily as needed. For pain    . MELATONIN PO Oral Take 1 tablet by mouth at bedtime as needed. For sleep      BP 118/82  Pulse 77  Temp(Src) 98 F (36.7 C) (Oral)  Resp 16  SpO2 99%  LMP 03/24/2012  Physical Exam  Nursing  note and vitals reviewed. Constitutional: She appears well-developed and well-nourished. No distress.  HENT:  Head: Normocephalic and atraumatic.  Mouth/Throat: Oropharynx is clear and moist.  Eyes: EOM are normal. Pupils are equal, round, and reactive to light.  Neck: Normal range of motion. Neck supple.  Cardiovascular: Normal rate, regular rhythm, normal heart sounds and intact distal pulses.   Pulmonary/Chest: Effort normal and breath sounds normal. No respiratory distress. She has no wheezes. She has no rales. She exhibits no tenderness.  Abdominal: Soft. Bowel sounds are normal.  Musculoskeletal: Normal range of motion.  Neurological: She is alert.  Skin: Skin is warm and dry. No rash  noted. She is not diaphoretic. No erythema.  Psychiatric: She has a normal mood and affect.    ED Course  Procedures (including critical care time)   Labs Reviewed  CBC  DIFFERENTIAL  POCT I-STAT TROPONIN I  BASIC METABOLIC PANEL   Dg Chest 2 View  03/27/2012  *RADIOLOGY REPORT*  Clinical Data: Sharp bilateral breast pain.  CHEST - 2 VIEW  Comparison: Chest x-ray 02/10/2012.  Findings: Lung volumes are normal.  No consolidative airspace disease.  No pleural effusions.  Again noted is a sub centimeter dense pulmonary nodule projecting over the left upper lobe.  No other larger more suspicious appearing pulmonary nodules or masses are otherwise identified. Heart size is normal.  Mediastinal contours are unremarkable.  IMPRESSION: 1.  No radiographic evidence of acute cardiopulmonary disease. 2.  Sub centimeter dense nodule again noted projecting over the left upper lobe.  This almost certainly represents a calcified granuloma in this young patient.  If of clinical concern, this could be further evaluated with noncontrast CT of the thorax, or could be followed with a repeat PA and lateral chest radiograph in 3-6 months.  Original Report Authenticated By: Florencia Reasons, M.D.     No diagnosis found.   Date: 03/27/2012  Rate: 68  Rhythm: normal sinus rhythm  QRS Axis: normal  Intervals: normal  ST/T Wave abnormalities: normal  Conduction Disutrbances:none  Narrative Interpretation:   Old EKG Reviewed: unchanged    MDM  Patient is to be discharged with recommendation to follow up with PCP in regards to today's hospital visit. Chest pain is not likely of cardiac or pulmonary etiology d/t presentation, perc negative, VSS, no tracheal deviation, no JVD or new murmur, RRR, breath sounds equal bilaterally, EKG without acute abnormalities, negative troponin, and no acute findings on CXR. Return precautions discussed with patient. Pt appears reliable for follow up and is agreeable to  discharge.   Case has been discussed with and seen by Dr. Juleen China who agrees with the above plan to discharge.         Pascal Lux Leola, PA-C 03/29/12 1210

## 2012-03-27 NOTE — Discharge Instructions (Signed)
Read instructions below for reasons to return to the Emergency Department. It is recommended that your follow up with your Primary Care Doctor in regards to today's visit. If you do not have a doctor, use the resource guide listed below to help you find one.  ° °Chest Pain (Nonspecific)  °HOME CARE INSTRUCTIONS  °For the next few days, avoid physical activities that bring on chest pain. Continue physical activities as directed.  °Do not smoke cigarettes or drink alcohol until your symptoms are gone.  °Only take over-the-counter or prescription medicine for pain, discomfort, or fever as directed by your caregiver.  °Follow your caregiver's suggestions for further testing if your chest pain does not go away.  °Keep any follow-up appointments you made. If you do not go to an appointment, you could develop lasting (chronic) problems with pain. If there is any problem keeping an appointment, you must call to reschedule.  °SEEK MEDICAL CARE IF:  °You think you are having problems from the medicine you are taking. Read your medicine instructions carefully.  °Your chest pain does not go away, even after treatment.  °You develop a rash with blisters on your chest.  °SEEK IMMEDIATE MEDICAL CARE IF:  °You have increased chest pain or pain that spreads to your arm, neck, jaw, back, or belly (abdomen).  °You develop shortness of breath, an increasing cough, or you are coughing up blood.  °You have severe back or abdominal pain, feel sick to your stomach (nauseous) or throw up (vomit).  °You develop severe weakness, fainting, or chills.  °You have an oral temperature above 102° F (38.9° C), not controlled by medicine.  ° °THIS IS AN EMERGENCY. Do not wait to see if the pain will go away. Get medical help at once. Call your local emergency services (911 in [place] Do not drive yourself to the hospital. ° ° °RESOURCE GUIDE ° °Dental Problems ° °Patients with Medicaid: °[facility]                     [facility] °[address]                                           [address] °Phone:  [phone]                                                  Phone:  [phone] ° °If unable to pay or uninsured, contact:  [facility] or [facility] to become qualified for the adult dental clinic. ° °Chronic Pain Problems °Contact [facility]  [phone] °Patients need to be referred by their primary care doctor. ° °Insufficient Money for Medicine °Contact [employer]:  call "211" or [facility] 271-5999. ° °No Primary Care Doctor °Call [facility]  832-8000 °Other agencies that provide inexpensive medical care °   [facility]  [phone] °   [facility]  832-7272 °   [facility]  [phone] °   [facility]  832-4777 °   [facility]  [phone] °   [facility]  [phone] ° °Psychological Services ° Health  [phone] °[facility]  [phone] °[facility]   [phone] (emergency services [phone]) ° °Substance Abuse Resources °[facility]  336-882-2125 °[facility] 336-784-9470 °[facility] 336-285-9073 °[facility] 336-845-3988 °Residential & Outpatient Substance Abuse Program  800-659-3381 ° °Abuse/Neglect °[place] [facility] (336) 641-3795 °[place] [facility] Hotline 800-378-5315 (After Hours) ° °Emergency Shelter °[facility] (336) 271-5985 ° °Maternity Homes °[facility] (336) 275-9566 °[facility] (704) 372-4663 ° °MRSA Hotline #:   832-7006 ° ° ° °[place] [facility] ° °[facility] [facility]     [employer]                          [facility] °[address] [place]                       [address]      [address]  °[place]                                                [place]                             [place] °Phone:  349-3220                                   Phone:  [phone]                 Phone:  342-8140 ° °[facility] °Phone:  342-8316 ° °[place] Child [facility] Hotline °(336) 342-1394 °(336) 342-3537 (After Hours) ° ° °

## 2012-03-31 NOTE — ED Provider Notes (Signed)
Medical screening examination/treatment/procedure(s) were conducted as a shared visit with non-physician practitioner(s) and myself.  I personally evaluated the patient during the encounter.  36 year old female with chest pain. Consider ACS but doubt. Pain is very atypical given very brief duration a few seconds. EKG is non-provocative. Very low suspicion for pulmonary embolism. Doubt infectious. Patient is well appearing. She is a fairly unremarkable physical exam. Return precautions discussed. Outpatient followup otherwise.  Raeford Razor, MD 03/31/12 2146

## 2012-08-27 ENCOUNTER — Emergency Department (HOSPITAL_COMMUNITY)
Admission: EM | Admit: 2012-08-27 | Discharge: 2012-08-27 | Disposition: A | Discharge status: Home / Self Care | Payer: Self-pay | Attending: Emergency Medicine | Admitting: Emergency Medicine

## 2012-08-27 ENCOUNTER — Encounter (HOSPITAL_COMMUNITY): Payer: Self-pay | Admitting: Emergency Medicine

## 2012-08-27 DIAGNOSIS — N644 Mastodynia: Secondary | ICD-10-CM | POA: Insufficient documentation

## 2012-08-27 DIAGNOSIS — Z8611 Personal history of tuberculosis: Secondary | ICD-10-CM | POA: Insufficient documentation

## 2012-08-27 DIAGNOSIS — Z87891 Personal history of nicotine dependence: Secondary | ICD-10-CM | POA: Insufficient documentation

## 2012-08-27 DIAGNOSIS — Z8619 Personal history of other infectious and parasitic diseases: Secondary | ICD-10-CM | POA: Insufficient documentation

## 2012-08-27 DIAGNOSIS — I10 Essential (primary) hypertension: Secondary | ICD-10-CM | POA: Insufficient documentation

## 2012-08-27 HISTORY — DX: Syphilis, unspecified: A53.9

## 2012-08-27 NOTE — ED Notes (Signed)
Reports L breast pain since June--quit smoking, and reports pain got better; pt denies CP, pt having pain solely in breast; hurts when touching breast; unsure if d/t wire bra; denies SOB, n/v/CP

## 2012-08-27 NOTE — ED Provider Notes (Signed)
History     CSN: [id number]  Arrival date & time 08/27/12  1949   None     Chief Complaint  Patient presents with  . Breast Pain    (Consider location/radiation/quality/duration/timing/severity/associated sxs/prior treatment) HPI History provided by pt.   Pt presents w/ c/o left lower breast discomfort x 4 months.  Has noticed a lump in this location as well.  The symptoms do not seem to wax and wane with her menstrual cycle.  She believes it may be related to her underwire bra.  No associated fever, skin changes, nipple discharge or bleeding from nipple or change in shape of nipple.  Does not have pain in her chest, cough or SOB.  Pt has never had a mammogram before.  There is no FH of breast, uterine or colon cancer.   Past Medical History  Diagnosis Date  . Hep C w/o coma, chronic   . Hypertension   . Tuberculosis of lung, nodular 1999  . Syphilis     Past Surgical History  Procedure Date  . Appendectomy   . Tubal ligation   . Examination under anesthesia 12/15/2011    Procedure: EXAM UNDER ANESTHESIA;  Surgeon: Rulon Abide, DO;  Location: [facility];  Service: General;  Laterality: N/A;  incison and drainage of peri rectal abcess  . Rectal surgery     History reviewed. No pertinent family history.  History  Substance Use Topics  . Smoking status: Former Smoker    Quit date: 04/02/2012  . Smokeless tobacco: Not on file  . Alcohol Use: No    OB History    Grav Para Term Preterm Abortions TAB SAB Ect Mult Living                  Review of Systems  All other systems reviewed and are negative.    Allergies  Tylenol  Home Medications   Current Outpatient Rx  Name Route Sig Dispense Refill  . CLONIDINE HCL 0.1 MG PO TABS Oral Take 0.1 mg by mouth daily.     Marland Kitchen CLEAR EYES OP Both Eyes Place 2 drops into both eyes daily as needed. For itching    . OVER THE COUNTER MEDICATION Oral Take 1 tablet by mouth daily as needed. Allergy and sinus medicine      BP  127/85  Pulse 55  Temp 98.3 F (36.8 C) (Oral)  Resp 16  SpO2 100%  Physical Exam  Nursing note and vitals reviewed. Constitutional: She is oriented to person, place, and time. She appears well-developed and well-nourished. No distress.  HENT:  Head: Normocephalic and atraumatic.  Eyes:       Normal appearance  Neck: Normal range of motion.  Pulmonary/Chest: Effort normal.       Breasts are symmetrical.  No skin changes, including dimpling.  No discharge or bleeding from either nipple.  Mild tenderness just inferior to left breast but no palpable mass in this location.  Breast tissue feels the same on both left and right breast.    Musculoskeletal: Normal range of motion.  Neurological: She is alert and oriented to person, place, and time.  Psychiatric: She has a normal mood and affect. Her behavior is normal.    ED Course  Procedures (including critical care time)  Labs Reviewed - No data to display No results found.   1. Breast pain       MDM  Pt presents w/ non-traumatic L breast pain and lump.  On exam, afebrile,  breasts symmetric, no skin changes, no palpable mass, mild tenderness inferior to left nipple.  Pt low risk for breast cancer.  [facility]  Return precautions discussed.         Arie Sabina Strang, [place] 08/27/12 2051

## 2012-08-28 NOTE — ED Provider Notes (Signed)
Medical screening examination/treatment/procedure(s) were performed by non-physician practitioner and as supervising physician I was immediately available for consultation/collaboration.   Fumiye Lubben, MD 08/28/12 2307 

## 2012-09-19 ENCOUNTER — Emergency Department (HOSPITAL_COMMUNITY)
Admission: EM | Admit: 2012-09-19 | Discharge: 2012-09-19 | Disposition: A | Discharge status: Home / Self Care | Payer: Medicaid Other | Attending: Emergency Medicine | Admitting: Emergency Medicine

## 2012-09-19 ENCOUNTER — Encounter (HOSPITAL_COMMUNITY): Payer: Self-pay | Admitting: *Deleted

## 2012-09-19 ENCOUNTER — Emergency Department (HOSPITAL_COMMUNITY): Payer: Medicaid Other

## 2012-09-19 DIAGNOSIS — R059 Cough, unspecified: Secondary | ICD-10-CM | POA: Insufficient documentation

## 2012-09-19 DIAGNOSIS — I10 Essential (primary) hypertension: Secondary | ICD-10-CM | POA: Insufficient documentation

## 2012-09-19 DIAGNOSIS — J40 Bronchitis, not specified as acute or chronic: Secondary | ICD-10-CM | POA: Insufficient documentation

## 2012-09-19 DIAGNOSIS — Z8619 Personal history of other infectious and parasitic diseases: Secondary | ICD-10-CM | POA: Insufficient documentation

## 2012-09-19 DIAGNOSIS — R05 Cough: Secondary | ICD-10-CM

## 2012-09-19 MED ORDER — AZITHROMYCIN 250 MG PO TABS: ORAL_TABLET | ORAL | Status: DC

## 2012-09-19 MED ORDER — ALBUTEROL SULFATE HFA 108 (90 BASE) MCG/ACT IN AERS: 2.0000 | INHALATION_SPRAY | RESPIRATORY_TRACT | Status: DC | PRN

## 2012-09-19 NOTE — ED Notes (Signed)
Pt reports cough x 1 week.  Hx of TB.  Reports sputum is 'lime green'.  Denies fever, night sweats.

## 2012-09-19 NOTE — ED Provider Notes (Addendum)
History     CSN: [id number]  Arrival date & time 09/19/12  1233   First MD Initiated Contact with Patient 09/19/12 1354      Chief Complaint  Patient presents with  . Cough    (Consider location/radiation/quality/duration/timing/severity/associated sxs/prior treatment) Patient is a 36 y.o. female presenting with cough. The history is provided by the patient.  Cough Pertinent negatives include no chest pain, no chills, no headaches, no sore throat, no shortness of breath and no eye redness.  pt c/o productive cough for past week. Greenish sputum. Episodic, denies specific exacerbating or alleviating factors. Denies sore throat or runny nose. No fever/chills. No known ill contacts. No headache. No body aches. No hx asthma. Former smoker. States remote hx tb 10+ years ago, was treated, no problems since.     Past Medical History  Diagnosis Date  . Hep C w/o coma, chronic   . Hypertension   . Tuberculosis of lung, nodular 1999  . Syphilis     Past Surgical History  Procedure Date  . Appendectomy   . Tubal ligation   . Examination under anesthesia 12/15/2011    Procedure: EXAM UNDER ANESTHESIA;  Surgeon: Rulon Abide, DO;  Location: [facility];  Service: General;  Laterality: N/A;  incison and drainage of peri rectal abcess  . Rectal surgery     History reviewed. No pertinent family history.  History  Substance Use Topics  . Smoking status: Former Smoker    Quit date: 04/02/2012  . Smokeless tobacco: Not on file  . Alcohol Use: No    OB History    Grav Para Term Preterm Abortions TAB SAB Ect Mult Living                  Review of Systems  Constitutional: Negative for fever and chills.  HENT: Negative for sore throat, neck pain and neck stiffness.   Eyes: Negative for discharge and redness.  Respiratory: Positive for cough. Negative for shortness of breath.   Cardiovascular: Negative for chest pain.  Gastrointestinal: Negative for vomiting, abdominal pain and  diarrhea.  Genitourinary: Negative for flank pain.  Musculoskeletal: Negative for back pain.  Skin: Negative for rash.  Neurological: Negative for headaches.  Hematological: Does not bruise/bleed easily.  Psychiatric/Behavioral: Negative for confusion.    Allergies  Tylenol  Home Medications   Current Outpatient Rx  Name Route Sig Dispense Refill  . CLONIDINE HCL 0.1 MG PO TABS Oral Take 0.1 mg by mouth daily.     Marland Kitchen CLEAR EYES OP Both Eyes Place 2 drops into both eyes daily as needed. For itching    . OVER THE COUNTER MEDICATION Oral Take 1 tablet by mouth daily as needed. Allergy and sinus medicine      BP 134/94  Pulse 65  Temp 98.5 F (36.9 C) (Oral)  Resp 20  SpO2 98%  Physical Exam  Nursing note and vitals reviewed. Constitutional: She is oriented to person, place, and time. She appears well-developed and well-nourished. No distress.  HENT:  Nose: Nose normal.  Mouth/Throat: Oropharynx is clear and moist.  Eyes: Conjunctivae normal are normal. No scleral icterus.  Neck: Neck supple. No tracheal deviation present.       No stiffness or rigidity  Cardiovascular: Normal rate, regular rhythm, normal heart sounds and intact distal pulses.  Exam reveals no gallop and no friction rub.   No murmur heard. Pulmonary/Chest: Effort normal. No respiratory distress.       Cough, upper resp congestion  Abdominal: Soft. Normal appearance and bowel sounds are normal. She exhibits no distension. There is no tenderness.  Musculoskeletal: She exhibits no edema and no tenderness.  Neurological: She is alert and oriented to person, place, and time.  Skin: Skin is warm and dry. No rash noted.  Psychiatric: She has a normal mood and affect.    ED Course  Procedures (including critical care time)   Dg Chest 2 View  09/19/2012  *RADIOLOGY REPORT*  Clinical Data: Cough.  Green sputum.  CHEST - 2 VIEW  Comparison: 03/29/2012  Findings: Heart size is normal.  No pleural effusion or  edema. There is no airspace consolidation noted.  Calcified granuloma noted within the left upper lobe.  Review of the visualized osseous structures is significant for a mild scoliosis deformity involving the thoracic spine.  IMPRESSION:  1.  No acute cardiopulmonary abnormalities. 2.  No evidence for pneumonia.   Original Report Authenticated By: Rosealee Albee, M.D.      MDM  Cxr.  Reviewed nursing notes and prior charts for additional history.    Pt notes hx bronchitis, productive cough x 1 week, will rx zpack, mdi.   At d/c pt notes area 'rash' to left forearm. Single sl indurated, sl raised couple mm lesion to volar aspect left forearm. Not tender. No erythema or warmth. No hives/urticaria. States has had a couple other similar areas over course months. Denies bug bite/sting. Not painful. No other lesions. No palms/soles. rec hydrocortisone cream. Requests derm referral.      Suzi Roots, MD 09/19/12 1447  Suzi Roots, MD 09/19/12 1501

## 2013-05-04 ENCOUNTER — Other Ambulatory Visit (HOSPITAL_COMMUNITY)
Admission: RE | Admit: 2013-05-04 | Discharge: 2013-05-04 | Disposition: A | Discharge status: Home / Self Care | Payer: Medicaid Other | Source: Ambulatory Visit | Attending: Family Medicine | Admitting: Family Medicine

## 2013-05-04 DIAGNOSIS — Z113 Encounter for screening for infections with a predominantly sexual mode of transmission: Secondary | ICD-10-CM | POA: Insufficient documentation

## 2013-05-04 DIAGNOSIS — Z1151 Encounter for screening for human papillomavirus (HPV): Secondary | ICD-10-CM | POA: Insufficient documentation

## 2013-05-04 DIAGNOSIS — Z01419 Encounter for gynecological examination (general) (routine) without abnormal findings: Secondary | ICD-10-CM | POA: Insufficient documentation

## 2013-05-04 DIAGNOSIS — N760 Acute vaginitis: Secondary | ICD-10-CM | POA: Insufficient documentation

## 2013-09-09 ENCOUNTER — Encounter (HOSPITAL_COMMUNITY): Payer: Self-pay | Admitting: Emergency Medicine

## 2013-09-09 ENCOUNTER — Emergency Department (HOSPITAL_COMMUNITY)
Admission: EM | Admit: 2013-09-09 | Discharge: 2013-09-09 | Disposition: A | Discharge status: Home / Self Care | Payer: Medicaid Other | Attending: Emergency Medicine | Admitting: Emergency Medicine

## 2013-09-09 DIAGNOSIS — Z3202 Encounter for pregnancy test, result negative: Secondary | ICD-10-CM | POA: Insufficient documentation

## 2013-09-09 DIAGNOSIS — L723 Sebaceous cyst: Secondary | ICD-10-CM | POA: Insufficient documentation

## 2013-09-09 DIAGNOSIS — I10 Essential (primary) hypertension: Secondary | ICD-10-CM | POA: Insufficient documentation

## 2013-09-09 DIAGNOSIS — Z8619 Personal history of other infectious and parasitic diseases: Secondary | ICD-10-CM | POA: Insufficient documentation

## 2013-09-09 DIAGNOSIS — Z87891 Personal history of nicotine dependence: Secondary | ICD-10-CM | POA: Insufficient documentation

## 2013-09-09 DIAGNOSIS — N760 Acute vaginitis: Secondary | ICD-10-CM | POA: Insufficient documentation

## 2013-09-09 DIAGNOSIS — L729 Follicular cyst of the skin and subcutaneous tissue, unspecified: Secondary | ICD-10-CM

## 2013-09-09 DIAGNOSIS — A499 Bacterial infection, unspecified: Secondary | ICD-10-CM | POA: Insufficient documentation

## 2013-09-09 DIAGNOSIS — R300 Dysuria: Secondary | ICD-10-CM | POA: Insufficient documentation

## 2013-09-09 DIAGNOSIS — Z792 Long term (current) use of antibiotics: Secondary | ICD-10-CM | POA: Insufficient documentation

## 2013-09-09 DIAGNOSIS — B9689 Other specified bacterial agents as the cause of diseases classified elsewhere: Secondary | ICD-10-CM | POA: Insufficient documentation

## 2013-09-09 DIAGNOSIS — Z79899 Other long term (current) drug therapy: Secondary | ICD-10-CM | POA: Insufficient documentation

## 2013-09-09 LAB — URINALYSIS, ROUTINE W REFLEX MICROSCOPIC
Glucose, UA: NEGATIVE mg/dL
Hgb urine dipstick: NEGATIVE
Specific Gravity, Urine: 1.01 (ref 1.005–1.030)
Urobilinogen, UA: 1 mg/dL (ref 0.0–1.0)

## 2013-09-09 LAB — WET PREP, GENITAL
WBC, Wet Prep HPF POC: NONE SEEN
Yeast Wet Prep HPF POC: NONE SEEN

## 2013-09-09 LAB — POCT PREGNANCY, URINE: Preg Test, Ur: NEGATIVE

## 2013-09-09 LAB — URINE MICROSCOPIC-ADD ON

## 2013-09-09 MED ORDER — METRONIDAZOLE 500 MG PO TABS: 500.0000 mg | ORAL_TABLET | Freq: Two times a day (BID) | ORAL | Status: DC

## 2013-09-09 MED ORDER — DOXYCYCLINE HYCLATE 100 MG PO CAPS: 100.0000 mg | ORAL_CAPSULE | Freq: Two times a day (BID) | ORAL | Status: DC

## 2013-09-09 NOTE — ED Notes (Signed)
MD at bedside. 

## 2013-09-09 NOTE — ED Provider Notes (Signed)
CSN: [id number]     Arrival date & time 09/09/13  1108 History   First MD Initiated Contact with Patient 09/09/13 1136     Chief Complaint  Patient presents with  . Rash  . Vaginal Discharge   (Consider location/radiation/quality/duration/timing/severity/associated sxs/prior Treatment) Patient is a 37 y.o. female presenting with rash and vaginal discharge. The history is provided by the patient.  Rash Associated symptoms: no abdominal pain, no fever, no headaches and no shortness of breath   Vaginal Discharge Associated symptoms: dysuria   Associated symptoms: no abdominal pain and no fever    patient describes a gray vaginal discharge itching and burning also burning with urination rash on her buttocks area. Patient states housemate recently treated for staph MRSA infection. Patient denies any abdominal pain chest pain or shortness of breath no fevers. No recent travel.  Past Medical History  Diagnosis Date  . Hep C w/o coma, chronic   . Hypertension   . Tuberculosis of lung, nodular 1999  . Syphilis    Past Surgical History  Procedure Laterality Date  . Appendectomy    . Tubal ligation    . Examination under anesthesia  12/15/2011    Procedure: EXAM UNDER ANESTHESIA;  Surgeon: Rulon Abide, DO;  Location: [facility];  Service: General;  Laterality: N/A;  incison and drainage of peri rectal abcess  . Rectal surgery     No family history on file. History  Substance Use Topics  . Smoking status: Former Smoker    Quit date: 04/02/2012  . Smokeless tobacco: Not on file  . Alcohol Use: No   OB History   Grav Para Term Preterm Abortions TAB SAB Ect Mult Living                 Review of Systems  Constitutional: Negative for fever.  HENT: Negative for congestion.   Eyes: Negative for redness.  Respiratory: Negative for shortness of breath.   Cardiovascular: Negative for chest pain.  Gastrointestinal: Negative for abdominal pain.  Genitourinary: Positive for dysuria and  vaginal discharge.  Musculoskeletal: Negative for back pain.  Skin: Negative for rash.  Neurological: Negative for headaches.  Hematological: Does not bruise/bleed easily.  Psychiatric/Behavioral: Negative for confusion.    Allergies  Tylenol  Home Medications   Current Outpatient Rx  Name  Route  Sig  Dispense  Refill  . cholecalciferol (VITAMIN D) 1000 UNITS tablet   Oral   Take 2,000 Units by mouth daily.         . Garlic 450 MG CAPS   Oral   Take 450 mg by mouth 2 (two) times daily.         Marland Kitchen losartan-hydrochlorothiazide (HYZAAR) 50-12.5 MG per tablet   Oral   Take 1 tablet by mouth daily.         Marland Kitchen doxycycline (VIBRAMYCIN) 100 MG capsule   Oral   Take 1 capsule (100 mg total) by mouth 2 (two) times daily.   14 capsule   0   . metroNIDAZOLE (FLAGYL) 500 MG tablet   Oral   Take 1 tablet (500 mg total) by mouth 2 (two) times daily.   14 tablet   0    BP 127/87  Pulse 71  Temp(Src) 98.2 F (36.8 C) (Oral)  Resp 18  Ht 6' (1.829 m)  Wt 245 lb (111.131 kg)  BMI 33.22 kg/m2  SpO2 99%  LMP 08/29/2013 Physical Exam  Constitutional: She is oriented to person, place, and time. She  appears well-developed and well-nourished. No distress.  HENT:  Head: Normocephalic and atraumatic.  Pulmonary/Chest: Effort normal and breath sounds normal. No respiratory distress.  Abdominal: Soft. Bowel sounds are normal. There is no tenderness.  Genitourinary: Vagina normal and uterus normal. No vaginal discharge found.  Musculoskeletal: Normal range of motion.  Neurological: She is alert and oriented to person, place, and time. No cranial nerve deficit. She exhibits normal muscle tone. Coordination normal.  Skin: Skin is warm. No erythema.  From the buttocks area 3 healing skin cyst. Inferior to the whole area 1 healing skin cyst. No significant abscess.    ED Course  Procedures (including critical care time) Labs Review Labs Reviewed  WET PREP, GENITAL - Abnormal;  Notable for the following:    Clue Cells Wet Prep HPF POC MANY (*)    All other components within normal limits  URINALYSIS, ROUTINE W REFLEX MICROSCOPIC - Abnormal; Notable for the following:    Leukocytes, UA TRACE (*)    All other components within normal limits  GC/CHLAMYDIA PROBE AMP  URINE MICROSCOPIC-ADD ON  POCT PREGNANCY, URINE   Results for orders placed during the hospital encounter of 09/09/13  WET PREP, GENITAL      Result Value Range   Yeast Wet Prep HPF POC NONE SEEN  NONE SEEN   Trich, Wet Prep NONE SEEN  NONE SEEN   Clue Cells Wet Prep HPF POC MANY (*) NONE SEEN   WBC, Wet Prep HPF POC NONE SEEN  NONE SEEN  URINALYSIS, ROUTINE W REFLEX MICROSCOPIC      Result Value Range   Color, Urine YELLOW  YELLOW   APPearance CLEAR  CLEAR   Specific Gravity, Urine 1.010  1.005 - 1.030   pH 6.0  5.0 - 8.0   Glucose, UA NEGATIVE  NEGATIVE mg/dL   Hgb urine dipstick NEGATIVE  NEGATIVE   Bilirubin Urine NEGATIVE  NEGATIVE   Ketones, ur NEGATIVE  NEGATIVE mg/dL   Protein, ur NEGATIVE  NEGATIVE mg/dL   Urobilinogen, UA 1.0  0.0 - 1.0 mg/dL   Nitrite NEGATIVE  NEGATIVE   Leukocytes, UA TRACE (*) NEGATIVE  URINE MICROSCOPIC-ADD ON      Result Value Range   Squamous Epithelial / LPF RARE  RARE   WBC, UA 7-10  <3 WBC/hpf   Bacteria, UA RARE  RARE  POCT PREGNANCY, URINE      Result Value Range   Preg Test, Ur NEGATIVE  NEGATIVE    Imaging Review No results found.  EKG Interpretation   None       MDM   1. Bacterial vaginosis   2. Skin cyst    Evaluation reveals healing skin cyst around the buttocks area no significant abscess. No significant folliculitis. Will treat with doxycycline patient said several of these popping up. So vaginal exam with no significant discharge or abnormalities. Wet prep consistent with bacterial vaginosis we'll treat with Flagyl. Formal culture still pending. Urinalysis with 7-10 whites but rare bacteria we'll not treat as a urinary tract  infection. Previously test was negative.    [name], MD 09/09/13 [phone]

## 2013-09-09 NOTE — ED Notes (Signed)
Pt undressed, in gown; pelvic cart set up at bedside

## 2013-09-09 NOTE — ED Notes (Signed)
Pt with grey, vaginal discharge and itching, burning with urination and rash on bottom (states house mate recently dx with staff).

## 2013-09-09 NOTE — ED Notes (Signed)
Pt comfortable with d/c and f/u instructions. Prescriptions x2. 

## 2013-10-04 ENCOUNTER — Emergency Department (HOSPITAL_COMMUNITY)
Admission: EM | Admit: 2013-10-04 | Discharge: 2013-10-04 | Disposition: A | Discharge status: Home / Self Care | Payer: Medicaid Other | Attending: Emergency Medicine | Admitting: Emergency Medicine

## 2013-10-04 ENCOUNTER — Encounter (HOSPITAL_COMMUNITY): Payer: Self-pay | Admitting: Emergency Medicine

## 2013-10-04 DIAGNOSIS — I10 Essential (primary) hypertension: Secondary | ICD-10-CM | POA: Insufficient documentation

## 2013-10-04 DIAGNOSIS — Z79899 Other long term (current) drug therapy: Secondary | ICD-10-CM | POA: Insufficient documentation

## 2013-10-04 DIAGNOSIS — Z87891 Personal history of nicotine dependence: Secondary | ICD-10-CM | POA: Insufficient documentation

## 2013-10-04 DIAGNOSIS — Z888 Allergy status to other drugs, medicaments and biological substances status: Secondary | ICD-10-CM | POA: Insufficient documentation

## 2013-10-04 DIAGNOSIS — Z8611 Personal history of tuberculosis: Secondary | ICD-10-CM | POA: Insufficient documentation

## 2013-10-04 DIAGNOSIS — Z8619 Personal history of other infectious and parasitic diseases: Secondary | ICD-10-CM | POA: Insufficient documentation

## 2013-10-04 DIAGNOSIS — Z8719 Personal history of other diseases of the digestive system: Secondary | ICD-10-CM | POA: Insufficient documentation

## 2013-10-04 DIAGNOSIS — R21 Rash and other nonspecific skin eruption: Secondary | ICD-10-CM | POA: Insufficient documentation

## 2013-10-04 MED ORDER — DOXYCYCLINE HYCLATE 100 MG PO CAPS: 100.0000 mg | ORAL_CAPSULE | Freq: Two times a day (BID) | ORAL | Status: DC

## 2013-10-04 NOTE — ED Notes (Signed)
PA at bedside.

## 2013-10-04 NOTE — ED Notes (Signed)
Pt here for rash on face. States she came here recently for abscess to buttock which was tx with abx. States it is not completely healed.

## 2013-10-04 NOTE — ED Notes (Signed)
sts has rash on face, and exposed to someone else that moved in with her and had same rash and was told staph infection.

## 2013-10-04 NOTE — ED Provider Notes (Signed)
CSN: [id number]     Arrival date & time 10/04/13  1203 History  This chart was scribed for Arthor Captain, PA working with Audree Camel, MD by Quintella Reichert, ED Scribe. This patient was seen in room TR11C/TR11C and the patient's care was started at 2:02 PM.   Chief Complaint  Patient presents with  . Rash    The history is provided by the patient. No language interpreter was used.    HPI Comments: Brittany Duarte is a 37 y.o. female who presents to the Emergency Department complaining of a progressively-worsening facial rash that began 3 days ago.  Pt describes rash as itchy "bumps."  She states that new bumps appear on waking each morning.  She notes that she spent the night at her boyfriend's house before her rash began.  She also notes that her roommate recently developed an "oozing" purulent rash on her face and was treated for a staph MRSA infection, and she is concerned that her rash may be the same.    Past Medical History  Diagnosis Date  . Hep C w/o coma, chronic   . Hypertension   . Tuberculosis of lung, nodular 1999  . Syphilis     Past Surgical History  Procedure Laterality Date  . Appendectomy    . Tubal ligation    . Examination under anesthesia  12/15/2011    Procedure: EXAM UNDER ANESTHESIA;  Surgeon: Rulon Abide, DO;  Location: [facility];  Service: General;  Laterality: N/A;  incison and drainage of peri rectal abcess  . Rectal surgery      History reviewed. No pertinent family history.   History  Substance Use Topics  . Smoking status: Former Smoker    Quit date: 04/02/2012  . Smokeless tobacco: Not on file  . Alcohol Use: No    OB History   Grav Para Term Preterm Abortions TAB SAB Ect Mult Living                  Review of Systems  Constitutional: Negative for fever.  Skin: Positive for rash.     Allergies  Tylenol  Home Medications   Current Outpatient Rx  Name  Route  Sig  Dispense  Refill  . cholecalciferol (VITAMIN D) 1000  UNITS tablet   Oral   Take 2,000 Units by mouth daily.         Marland Kitchen doxycycline (VIBRAMYCIN) 100 MG capsule   Oral   Take 1 capsule (100 mg total) by mouth 2 (two) times daily.   14 capsule   0   . Garlic 450 MG CAPS   Oral   Take 450 mg by mouth 2 (two) times daily.         Marland Kitchen losartan-hydrochlorothiazide (HYZAAR) 50-12.5 MG per tablet   Oral   Take 1 tablet by mouth daily.         . metroNIDAZOLE (FLAGYL) 500 MG tablet   Oral   Take 1 tablet (500 mg total) by mouth 2 (two) times daily.   14 tablet   0    BP 121/77  Pulse 100  Temp(Src) 98.6 F (37 C) (Oral)  Resp 18  Ht 6' (1.829 m)  Wt 255 lb (115.667 kg)  BMI 34.58 kg/m2  SpO2 99%  LMP 08/29/2013  Physical Exam  Nursing note and vitals reviewed. Constitutional: She is oriented to person, place, and time. She appears well-developed and well-nourished. No distress.  HENT:  Head: Normocephalic and atraumatic.  Eyes: EOM  are normal.  Neck: Neck supple. No tracheal deviation present.  Cardiovascular: Normal rate.   Pulmonary/Chest: Effort normal. No respiratory distress.  Musculoskeletal: Normal range of motion.  Neurological: She is alert and oriented to person, place, and time.  Skin: Skin is warm and dry. Rash noted.  3 singular indurated crusted lesions on the face.  No signs of infection, no purulent discharge.  Psychiatric: She has a normal mood and affect. Her behavior is normal.    ED Course  Procedures (including critical care time)  DIAGNOSTIC STUDIES: Oxygen Saturation is 99% on room air, normal by my interpretation.    COORDINATION OF CARE: 2:11 PM-Discussed treatment plan which includes antibiotics with pt at bedside and pt agreed to plan.    Labs Review Labs Reviewed - No data to display  Imaging Review No results found.  EKG Interpretation   None       MDM  No diagnosis found. Patient with rash. Does not appear infected, however facial rash is acneiform, possiblility of  staph infection. Will d/c patien with doxycyline. F/u with pcp.    I personally performed the services described in this documentation, which was scribed in my presence. The recorded information has been reviewed and is accurate.     Arthor Captain, PA-C 10/08/13 1147

## 2013-10-04 NOTE — ED Notes (Signed)
Waiting for discharge instructions. 

## 2013-10-08 NOTE — ED Provider Notes (Signed)
Medical screening examination/treatment/procedure(s) were performed by non-physician practitioner and as supervising physician I was immediately available for consultation/collaboration.  EKG Interpretation   None         Audree Camel, MD 10/08/13 1624

## 2013-10-17 IMAGING — CR DG CHEST 2V
2 series · 2 of 2 positions shown · non-contrast
Comparison: Chest x-ray 02/10/2012.

CLINICAL DATA: Sharp bilateral breast pain.

CHEST - 2 VIEW

[w chest pa]
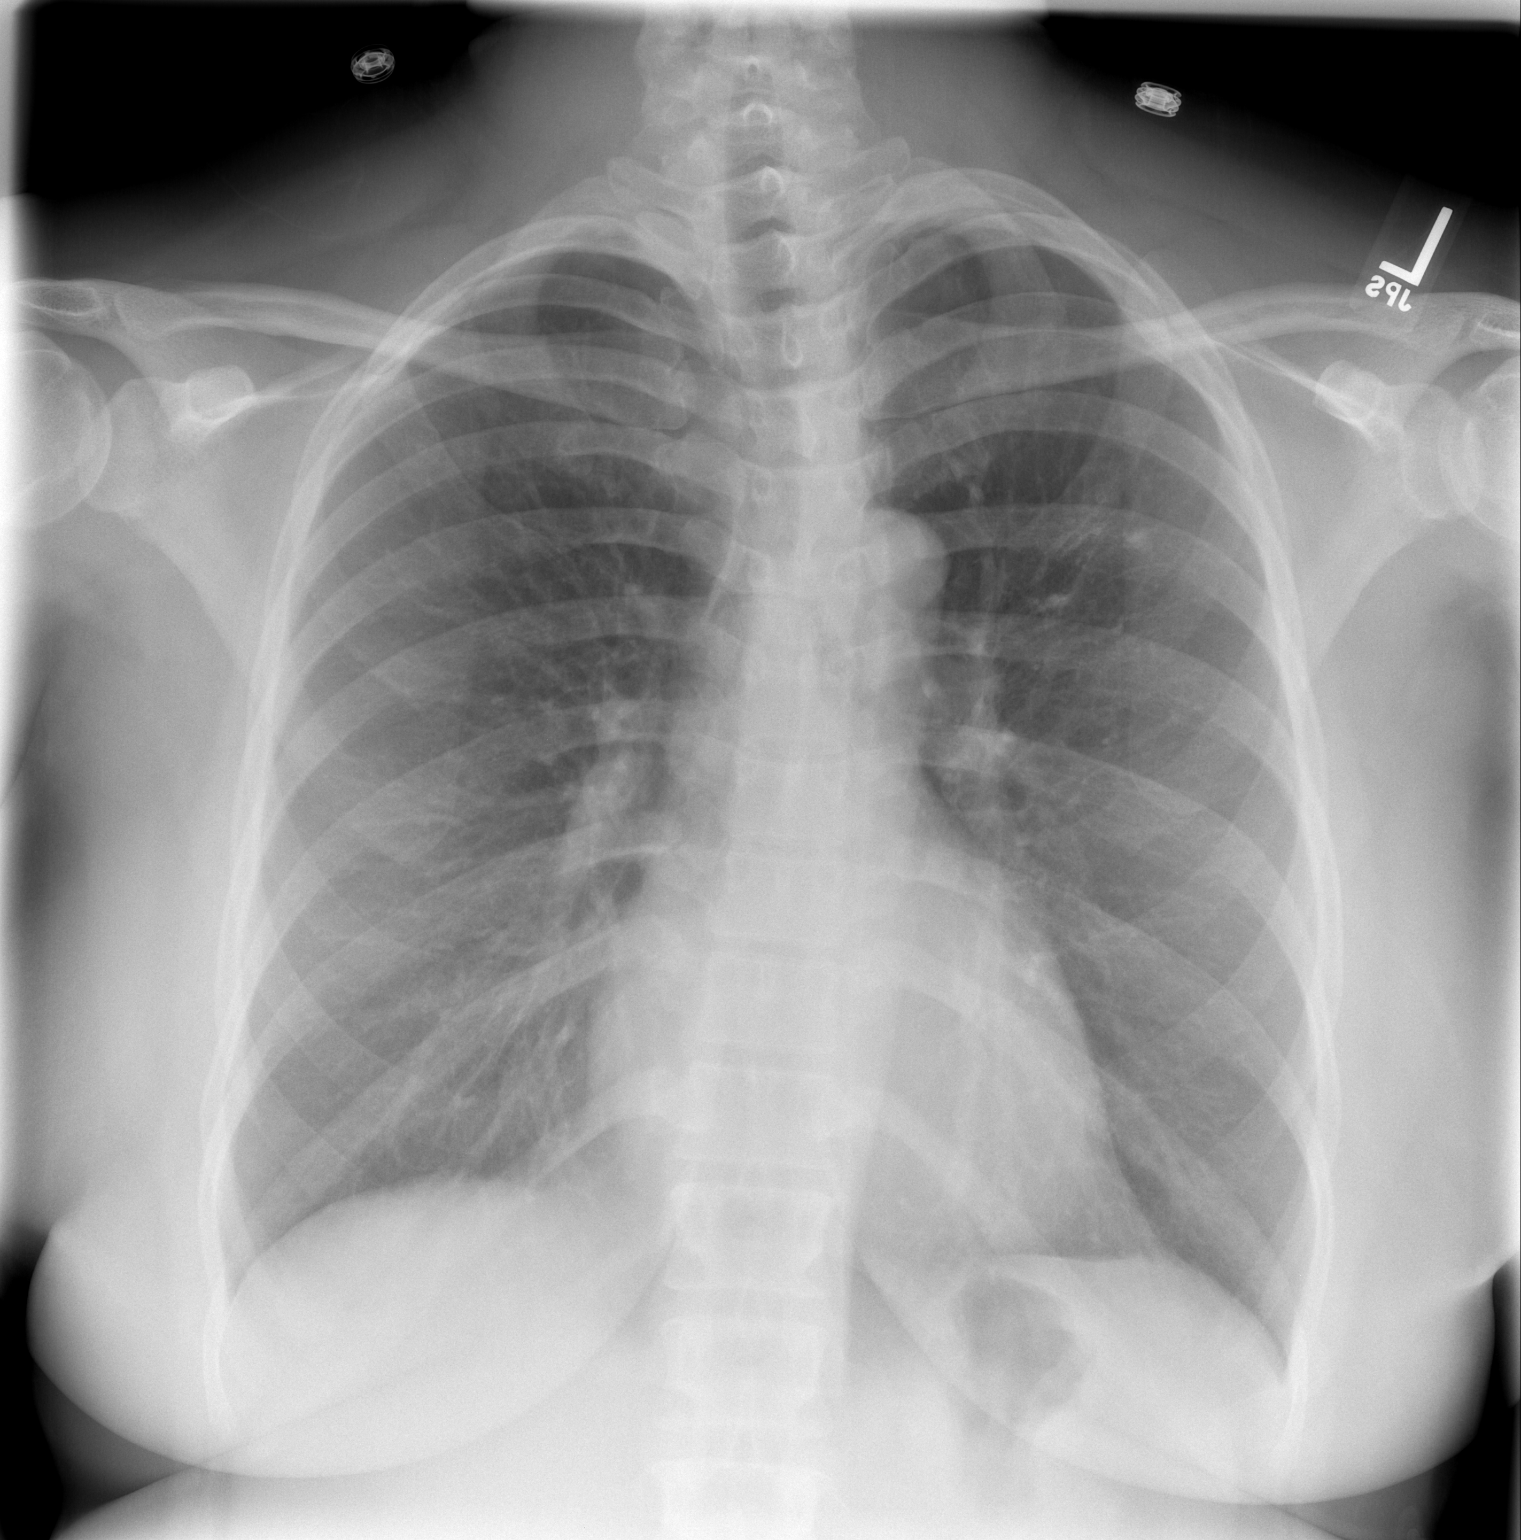

[w chest lat]
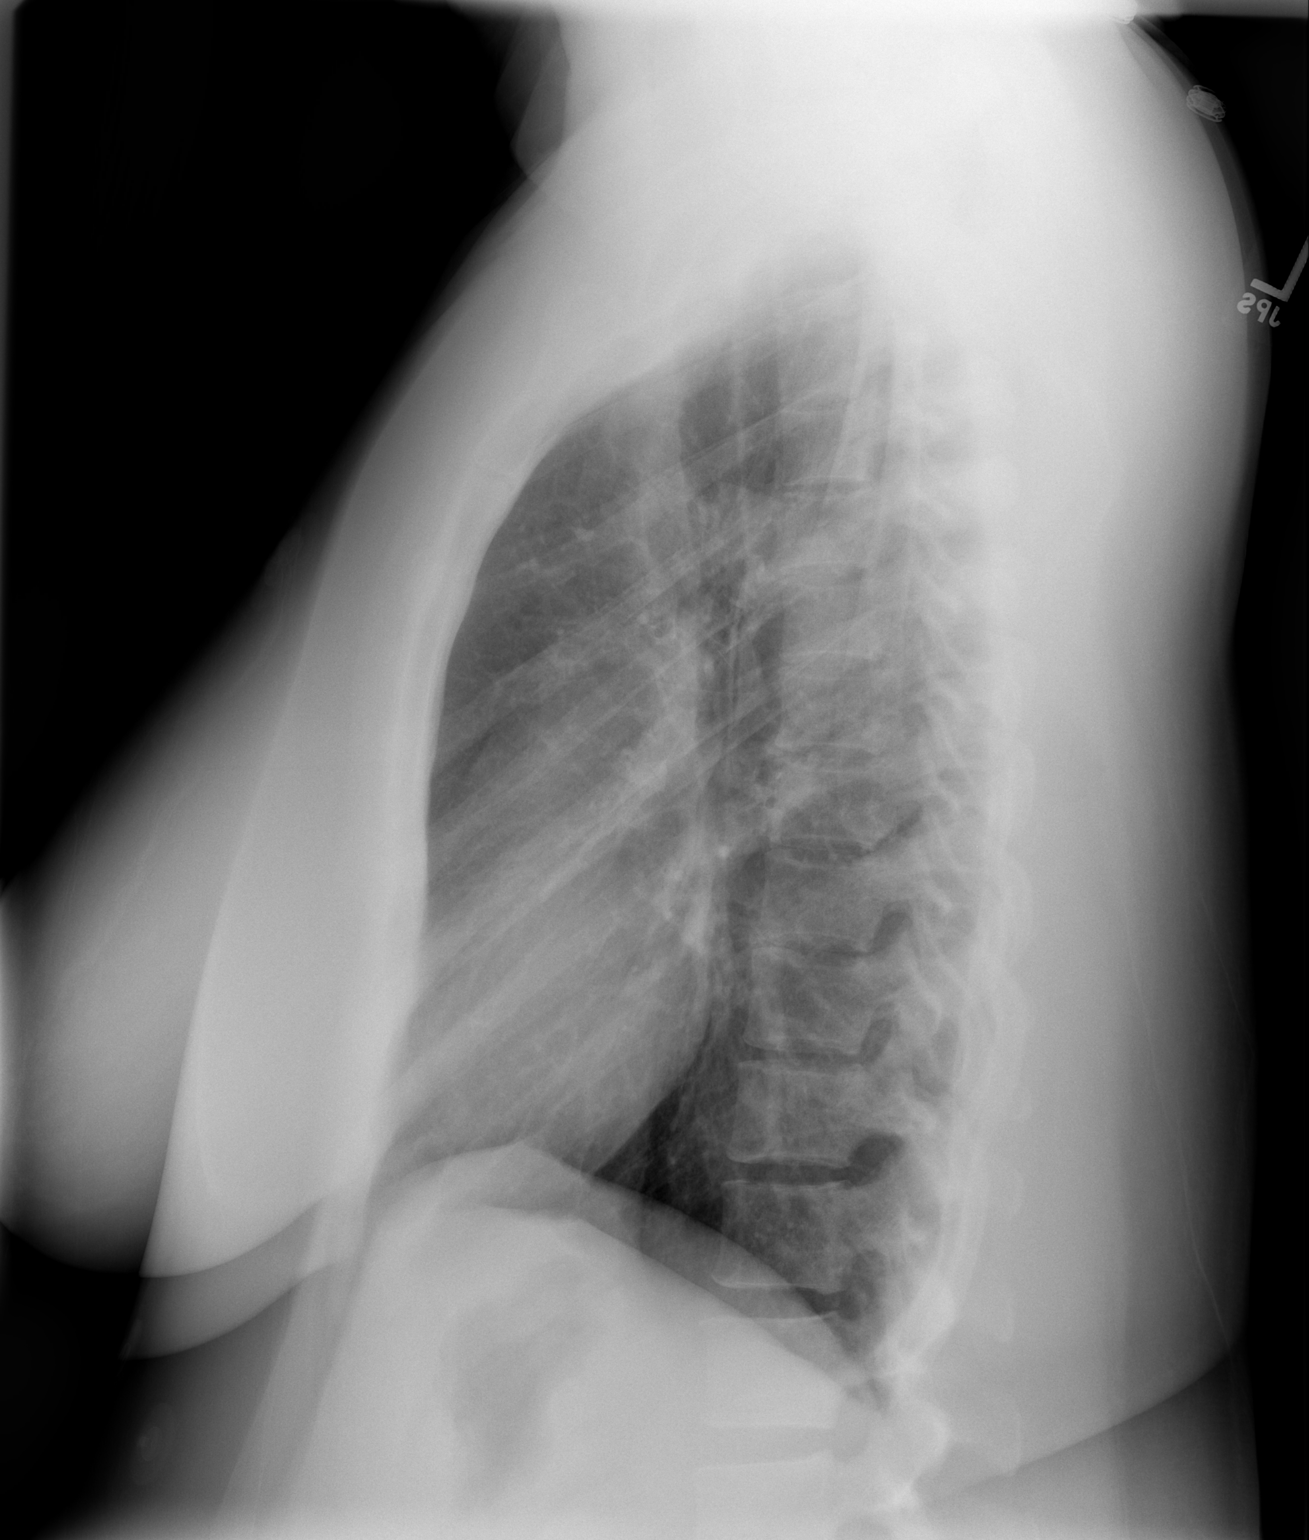

[2 of 2 positions shown; findings below may reference images not displayed]

FINDINGS: Lung volumes are normal.  No consolidative airspace
disease.  No pleural effusions.  Again noted is a sub centimeter
dense pulmonary nodule projecting over the left upper lobe.  No
other larger more suspicious appearing pulmonary nodules or masses
are otherwise identified. Heart size is normal.  Mediastinal
contours are unremarkable.
IMPRESSION: 1.  No radiographic evidence of acute cardiopulmonary disease.
2.  Sub centimeter dense nodule again noted projecting over the
left upper lobe.  This almost certainly represents a calcified
granuloma in this young patient.  If of clinical concern, this
could be further evaluated with noncontrast CT of the thorax, or
could be followed with a repeat PA and lateral chest radiograph in
3-6 months..

## 2014-01-15 ENCOUNTER — Emergency Department (HOSPITAL_COMMUNITY)
Admission: EM | Admit: 2014-01-15 | Discharge: 2014-01-15 | Disposition: A | Discharge status: Home / Self Care | Payer: 59 | Attending: Emergency Medicine | Admitting: Emergency Medicine

## 2014-01-15 ENCOUNTER — Encounter (HOSPITAL_COMMUNITY): Payer: Self-pay | Admitting: Emergency Medicine

## 2014-01-15 DIAGNOSIS — Z8619 Personal history of other infectious and parasitic diseases: Secondary | ICD-10-CM | POA: Insufficient documentation

## 2014-01-15 DIAGNOSIS — N898 Other specified noninflammatory disorders of vagina: Secondary | ICD-10-CM | POA: Diagnosis present

## 2014-01-15 DIAGNOSIS — S39012A Strain of muscle, fascia and tendon of lower back, initial encounter: Secondary | ICD-10-CM

## 2014-01-15 DIAGNOSIS — S339XXA Sprain of unspecified parts of lumbar spine and pelvis, initial encounter: Secondary | ICD-10-CM | POA: Insufficient documentation

## 2014-01-15 DIAGNOSIS — X58XXXA Exposure to other specified factors, initial encounter: Secondary | ICD-10-CM | POA: Diagnosis not present

## 2014-01-15 DIAGNOSIS — Z79899 Other long term (current) drug therapy: Secondary | ICD-10-CM | POA: Diagnosis not present

## 2014-01-15 DIAGNOSIS — Y939 Activity, unspecified: Secondary | ICD-10-CM | POA: Diagnosis not present

## 2014-01-15 DIAGNOSIS — I10 Essential (primary) hypertension: Secondary | ICD-10-CM | POA: Diagnosis not present

## 2014-01-15 DIAGNOSIS — S335XXA Sprain of ligaments of lumbar spine, initial encounter: Secondary | ICD-10-CM

## 2014-01-15 DIAGNOSIS — Z3202 Encounter for pregnancy test, result negative: Secondary | ICD-10-CM | POA: Diagnosis not present

## 2014-01-15 DIAGNOSIS — Y929 Unspecified place or not applicable: Secondary | ICD-10-CM | POA: Diagnosis not present

## 2014-01-15 DIAGNOSIS — F17200 Nicotine dependence, unspecified, uncomplicated: Secondary | ICD-10-CM | POA: Insufficient documentation

## 2014-01-15 DIAGNOSIS — N920 Excessive and frequent menstruation with regular cycle: Secondary | ICD-10-CM | POA: Insufficient documentation

## 2014-01-15 LAB — CBC
HEMATOCRIT: 37 % (ref 36.0–46.0)
Hemoglobin: 12.8 g/dL (ref 12.0–15.0)
MCH: 33.8 pg (ref 26.0–34.0)
MCHC: 34.6 g/dL (ref 30.0–36.0)
MCV: 97.6 fL (ref 78.0–100.0)
Platelets: 272 10*3/uL (ref 150–400)
RBC: 3.79 MIL/uL — ABNORMAL LOW (ref 3.87–5.11)
RDW: 13 % (ref 11.5–15.5)
WBC: 8.6 10*3/uL (ref 4.0–10.5)

## 2014-01-15 LAB — WET PREP, GENITAL
Clue Cells Wet Prep HPF POC: NONE SEEN
TRICH WET PREP: NONE SEEN
Yeast Wet Prep HPF POC: NONE SEEN

## 2014-01-15 MED ORDER — LIDOCAINE 5 % EX PTCH
1.0000 | MEDICATED_PATCH | CUTANEOUS | Status: DC
  Administered 2014-01-15: 1 via TRANSDERMAL
  Filled 2014-01-15 (×2): qty 1

## 2014-01-15 NOTE — ED Notes (Signed)
Pt is unable to void at this time.  

## 2014-01-15 NOTE — ED Provider Notes (Signed)
CSN: [id number]     Arrival date & time 01/15/14  0057 History   First MD Initiated Contact with Patient 01/15/14 0135     Chief Complaint  Patient presents with  . Vaginal Bleeding  . Back Pain     (Consider location/radiation/quality/duration/timing/severity/associated sxs/prior Treatment) HPI Comments: Patient states she started her menstrual cycle.  Several days late.  She noticed, when she went to the bathroom after developing low back pain.  At work, having a bowel movement.  She passed some tissue, which scared her and she came immediately to the emergency room for evaluation.  She has a history of bilateral tubal ligation.  She states she is sexually active, but the condom broke within the last, month.  She denies any vaginal discharge, but is concerned for an STD. She works at a [employer] as a Nurse, mental healthshorter cook and dishwasher.  She is a very call 1 minute.  Most of her work, surfaces.  She needs to stoop over.  She thinks this is adding to her low back pain.  She has not taken any medication.  She is, afraid to take any medication.  If she gets regular drug testing at work.  She also has a history of hepatitis, C., and does not want to do anything to further injure her liver  Patient is a 38 y.o. female presenting with vaginal bleeding and back pain. The history is provided by the patient.  Vaginal Bleeding Quality:  Passed tissue Severity:  Moderate Onset quality:  Sudden Timing:  Intermittent Progression:  Improving Chronicity:  New Menstrual history:  Regular Number of pads used:  1 Possible pregnancy: no   Context: spontaneously   Context: not genital trauma   Relieved by:  None tried Worsened by:  Nothing tried Ineffective treatments:  None tried Associated symptoms: back pain   Associated symptoms: no dysuria, no fever, no nausea and no vaginal discharge   Risk factors: unprotected sex   Back Pain Associated symptoms: no dysuria, no fever, no numbness and no weakness      Past Medical History  Diagnosis Date  . Hep C w/o coma, chronic   . Hypertension   . Tuberculosis of lung, nodular 1999  . Syphilis    Past Surgical History  Procedure Laterality Date  . Appendectomy    . Tubal ligation    . Examination under anesthesia  12/15/2011    Procedure: EXAM UNDER ANESTHESIA;  Surgeon: Rulon AbideBrian David Layton, DO;  Location: [facility];  Service: General;  Laterality: N/A;  incison and drainage of peri rectal abcess  . Rectal surgery     History reviewed. No pertinent family history. History  Substance Use Topics  . Smoking status: Current Some Day Smoker  . Smokeless tobacco: Not on file  . Alcohol Use: No   OB History   Grav Para Term Preterm Abortions TAB SAB Ect Mult Living                 Review of Systems  Constitutional: Negative for fever.  Gastrointestinal: Negative for nausea, diarrhea, constipation, blood in stool and anal bleeding.  Genitourinary: Positive for vaginal bleeding. Negative for dysuria, flank pain, vaginal discharge and vaginal pain.  Musculoskeletal: Positive for back pain.  Skin: Negative for rash and wound.  Neurological: Negative for weakness and numbness.  All other systems reviewed and are negative.      Allergies  Tylenol  Home Medications   Current Outpatient Rx  Name  Route  Sig  Dispense  Refill  . cholecalciferol (VITAMIN D) 1000 UNITS tablet   Oral   Take 2,000 Units by mouth daily.         Marland Kitchen losartan-hydrochlorothiazide (HYZAAR) 50-12.5 MG per tablet   Oral   Take 1 tablet by mouth daily.         . metroNIDAZOLE (METROGEL) 0.75 % vaginal gel   Vaginal   Place 1 Applicatorful vaginally at bedtime.         . riboflavin (VITAMIN B-2) 100 MG TABS tablet   Oral   Take 100 mg by mouth daily.          BP 128/68  Pulse 64  Temp(Src) 98.7 F (37.1 C) (Oral)  Resp 16  Ht 6' (1.829 m)  Wt 254 lb (115.214 kg)  BMI 34.44 kg/m2  SpO2 100%  LMP 01/15/2014 Physical Exam  Constitutional: She  appears well-developed and well-nourished.  HENT:  Head: Normocephalic.  Eyes: Pupils are equal, round, and reactive to light.  Neck: Normal range of motion.  Cardiovascular: Normal rate and regular rhythm.   Pulmonary/Chest: Effort normal and breath sounds normal.  Abdominal: Soft. Bowel sounds are normal. There is no tenderness.  Genitourinary: Guaiac negative stool. Pelvic exam was performed with patient supine. There is bleeding around the vagina. No vaginal discharge found.  Musculoskeletal: Normal range of motion. She exhibits no edema and no tenderness.  Neurological: She is alert.  Skin: Skin is warm. No rash noted. No erythema.    ED Course  Procedures (including critical care time) Labs Review Labs Reviewed  WET PREP, GENITAL - Abnormal; Notable for the following:    WBC, Wet Prep HPF POC RARE (*)    All other components within normal limits  CBC - Abnormal; Notable for the following:    RBC 3.79 (*)    All other components within normal limits  GC/CHLAMYDIA PROBE AMP   Imaging Review No results found.  EKG Interpretation   None       MDM   Final diagnoses:  Menorrhagia  Lumbosacral strain   Due to patient's reluctance to take any by mouth medication for her pain.  I have placed a lidocaine patch, and recommend that she buys [employer], that she can use on a regular basis      Arman Filter, NP 01/15/14 [phone]

## 2014-01-15 NOTE — ED Notes (Signed)
Hold lab draw at this time per [facility] LLCchultz PA.

## 2014-01-15 NOTE — Discharge Instructions (Signed)
Abnormal Uterine Bleeding Abnormal uterine bleeding can affect women at various stages in life, including teenagers, women in their reproductive years, pregnant women, and women who have reached menopause. Several kinds of uterine bleeding are considered abnormal, including:  Bleeding or spotting between periods.   Bleeding after sexual intercourse.   Bleeding that is heavier or more than normal.   Periods that last longer than usual.  Bleeding after menopause.  Many cases of abnormal uterine bleeding are minor and simple to treat, while others are more serious. Any type of abnormal bleeding should be evaluated by your health care provider. Treatment will depend on the cause of the bleeding. HOME CARE INSTRUCTIONS Monitor your condition for any changes. The following actions may help to alleviate any discomfort you are experiencing:  Avoid the use of tampons and douches as directed by your health care provider.  Change your pads frequently. You should get regular pelvic exams and Pap tests. Keep all follow-up appointments for diagnostic tests as directed by your health care provider.  SEEK MEDICAL CARE IF:   Your bleeding lasts more than 1 week.   You feel dizzy at times.  SEEK IMMEDIATE MEDICAL CARE IF:   You pass out.   You are changing pads every 15 to 30 minutes.   You have abdominal pain.  You have a fever.   You become sweaty or weak.   You are passing large blood clots from the vagina.   You start to feel nauseous and vomit. MAKE SURE YOU:   Understand these instructions.  Will watch your condition.  Will get help right away if you are not doing well or get worse. Document Released: 11/18/2005 Document Revised: 07/21/2013 Document Reviewed: 06/17/2013 [facility] Patient Information 2014 [place], [place]. You can buy over-the-counter salon paz patches to apply to your back

## 2014-01-15 NOTE — ED Notes (Signed)
Pt reports lower back pain that began yesterday evening approx 2200 - pt states she later had a BM and noted blood/blood clots, pt states bleeding is from her vagina and is her menstrual cycle however states that her mentstrual cycle is usually not this heavy. Pt denies any dizziness at present.

## 2014-01-15 NOTE — ED Provider Notes (Signed)
Medical screening examination/treatment/procedure(s) were performed by non-physician practitioner and as supervising physician I was immediately available for consultation/collaboration.    Sunnie NielsenBrian Willey Due, MD 01/15/14 [phone]

## 2014-01-17 LAB — GC/CHLAMYDIA PROBE AMP
CT PROBE, AMP APTIMA: NEGATIVE
GC PROBE AMP APTIMA: NEGATIVE

## 2014-01-17 LAB — POCT PREGNANCY, URINE: Preg Test, Ur: NEGATIVE

## 2014-01-24 ENCOUNTER — Other Ambulatory Visit: Payer: Self-pay | Admitting: Internal Medicine

## 2014-01-24 DIAGNOSIS — B182 Chronic viral hepatitis C: Secondary | ICD-10-CM

## 2014-01-28 ENCOUNTER — Other Ambulatory Visit: Payer: 59

## 2014-02-01 ENCOUNTER — Ambulatory Visit
Admission: RE | Admit: 2014-02-01 | Discharge: 2014-02-01 | Disposition: A | Discharge status: Home / Self Care | Payer: 59 | Source: Ambulatory Visit | Attending: Internal Medicine | Admitting: Internal Medicine

## 2014-02-01 DIAGNOSIS — B182 Chronic viral hepatitis C: Secondary | ICD-10-CM

## 2014-07-04 ENCOUNTER — Emergency Department (HOSPITAL_COMMUNITY)
Admission: EM | Admit: 2014-07-04 | Discharge: 2014-07-04 | Disposition: A | Discharge status: Home / Self Care | Payer: 59 | Attending: Emergency Medicine | Admitting: Emergency Medicine

## 2014-07-04 ENCOUNTER — Emergency Department (HOSPITAL_COMMUNITY): Payer: 59

## 2014-07-04 ENCOUNTER — Encounter (HOSPITAL_COMMUNITY): Payer: Self-pay | Admitting: Emergency Medicine

## 2014-07-04 DIAGNOSIS — Z791 Long term (current) use of non-steroidal anti-inflammatories (NSAID): Secondary | ICD-10-CM | POA: Diagnosis not present

## 2014-07-04 DIAGNOSIS — F17200 Nicotine dependence, unspecified, uncomplicated: Secondary | ICD-10-CM | POA: Insufficient documentation

## 2014-07-04 DIAGNOSIS — M79609 Pain in unspecified limb: Secondary | ICD-10-CM | POA: Diagnosis present

## 2014-07-04 DIAGNOSIS — I10 Essential (primary) hypertension: Secondary | ICD-10-CM | POA: Insufficient documentation

## 2014-07-04 DIAGNOSIS — B353 Tinea pedis: Secondary | ICD-10-CM | POA: Diagnosis not present

## 2014-07-04 MED ORDER — IBUPROFEN 800 MG PO TABS
800.0000 mg | ORAL_TABLET | Freq: Once | ORAL | Status: AC
  Administered 2014-07-04: 800 mg via ORAL
  Filled 2014-07-04: qty 1

## 2014-07-04 MED ORDER — TERBINAFINE HCL 1 % EX CREA: 1.0000 "application " | TOPICAL_CREAM | Freq: Two times a day (BID) | CUTANEOUS | Status: DC

## 2014-07-04 NOTE — ED Notes (Addendum)
C/o R foot & ankle pain & swelling. CMS intact. Mentions twisting ankle 1 year ago. Worse after work. Onset months ago. Worse with movement and walking.

## 2014-07-04 NOTE — ED Notes (Signed)
Discharge instructions reviewed with pt. Pt verbalized understanding.   

## 2014-07-04 NOTE — Discharge Instructions (Signed)
Please use the antifungal cream prescribed for rear foot for the next month twice a day. Followup with primary care provider or foot specialist for continued evaluation and treatment. Return any time for changing or worsening symptoms.    Athlete's Foot Athlete's foot (tinea pedis) is a fungal infection of the skin on the feet. It often occurs on the skin between the toes or underneath the toes. It can also occur on the soles of the feet. Athlete's foot is more likely to occur in hot, humid weather. Not washing your feet or changing your socks often enough can contribute to athlete's foot. The infection can spread from person to person (contagious). CAUSES Athlete's foot is caused by a fungus. This fungus thrives in warm, moist places. Most people get athlete's foot by sharing shower stalls, towels, and wet floors with an infected person. People with weakened immune systems, including those with diabetes, may be more likely to get athlete's foot. SYMPTOMS   Itchy areas between the toes or on the soles of the feet.  White, flaky, or scaly areas between the toes or on the soles of the feet.  Tiny, intensely itchy blisters between the toes or on the soles of the feet.  Tiny cuts on the skin. These cuts can develop a bacterial infection.  Thick or discolored toenails. DIAGNOSIS  Your caregiver can usually tell what the problem is by doing a physical exam. Your caregiver may also take a skin sample from the rash area. The skin sample may be examined under a microscope, or it may be tested to see if fungus will grow in the sample. A sample may also be taken from your toenail for testing. TREATMENT  Over-the-counter and prescription medicines can be used to kill the fungus. These medicines are available as powders or creams. Your caregiver can suggest medicines for you. Fungal infections respond slowly to treatment. You may need to continue using your medicine for several weeks. PREVENTION   Do not  share towels.  Wear sandals in wet areas, such as shared locker rooms and shared showers.  Keep your feet dry. Wear shoes that allow air to circulate. Wear cotton or wool socks. HOME CARE INSTRUCTIONS   Take medicines as directed by your caregiver. Do not use steroid creams on athlete's foot.  Keep your feet clean and cool. Wash your feet daily and dry them thoroughly, especially between your toes.  Change your socks every day. Wear cotton or wool socks. In hot climates, you may need to change your socks 2 to 3 times per day.  Wear sandals or canvas tennis shoes with good air circulation.  If you have blisters, soak your feet in Burow's solution or Epsom salts for 20 to 30 minutes, 2 times a day to dry out the blisters. Make sure you dry your feet thoroughly afterward. SEEK MEDICAL CARE IF:   You have a fever.  You have swelling, soreness, warmth, or redness in your foot.  You are not getting better after 7 days of treatment.  You are not completely cured after 30 days.  You have any problems caused by your medicines. MAKE SURE YOU:   Understand these instructions.  Will watch your condition.  Will get help right away if you are not doing well or get worse. Document Released: 11/15/2000 Document Revised: 02/10/2012 Document Reviewed: 09/06/2011 [facility] Patient Information 2015 [place], [place]. This information is not intended to replace advice given to you by your health care provider. Make sure you discuss any  questions you have with your health care provider. ° °

## 2014-07-04 NOTE — ED Provider Notes (Signed)
Medical screening examination/treatment/procedure(s) were performed by non-physician practitioner and as supervising physician I was immediately available for consultation/collaboration.   EKG Interpretation None       Breland Trouten K Letha Mirabal-Rasch, MD 07/04/14 0543 

## 2014-07-04 NOTE — ED Provider Notes (Signed)
CSN: [id number]     Arrival date & time 07/04/14  0052 History   First MD Initiated Contact with Patient 07/04/14 0448     Chief Complaint  Patient presents with  . Foot Pain  . Ankle Pain  . Joint Swelling   HPI  History provided by the patient. Patient is a 38 year old female with history of hypertension, hepatitis C who presents with complaints of burning and pain to the right foot and lower leg with swelling. Patient states that she often has swelling of her bilateral feet but has had a lot of problems with her right foot especially the skin with burning and swelling. She stands at work all day and this was getting worse last night and she came to the emergency room. She does report swelling is better after rest and elevation. She denies any redness or increased warmth of the feet. Denies any fever, chills or sweats. She does report using [employer] medicated powder as for her feet without much change in symptoms. Her symptoms have been chronic for many months. Denies any injuries or trauma. No other aggravating or alleviating factors. No other associated symptoms. No history of DVTs or PE. No chest pain or shortness of breath.    Past Medical History  Diagnosis Date  . Hep C w/o coma, chronic   . Hypertension   . Tuberculosis of lung, nodular 1999  . Syphilis    Past Surgical History  Procedure Laterality Date  . Appendectomy    . Tubal ligation    . Examination under anesthesia  12/15/2011    Procedure: EXAM UNDER ANESTHESIA;  Surgeon: Rulon AbideBrian David Layton, DO;  Location: [facility];  Service: General;  Laterality: N/A;  incison and drainage of peri rectal abcess  . Rectal surgery     No family history on file. History  Substance Use Topics  . Smoking status: Current Some Day Smoker  . Smokeless tobacco: Not on file  . Alcohol Use: No   OB History   Grav Para Term Preterm Abortions TAB SAB Ect Mult Living                 Review of Systems  Constitutional: Negative for fever,  chills and diaphoresis.  All other systems reviewed and are negative.     Allergies  Tylenol  Home Medications   Prior to Admission medications   Medication Sig Start Date End Date Taking? Authorizing Provider  losartan-hydrochlorothiazide (HYZAAR) 50-12.5 MG per tablet Take 1 tablet by mouth daily.   Yes Historical Provider, MD  Multiple Vitamin (MULTIVITAMIN WITH MINERALS) TABS tablet Take 1 tablet by mouth daily.   Yes Historical Provider, MD  Naproxen Sodium (ALEVE PO) Take 2 tablets by mouth daily as needed (for pain).   Yes Historical Provider, MD   BP 126/84  Pulse 64  Temp(Src) 98.9 F (37.2 C)  Resp 16  SpO2 100%  LMP 06/28/2014 Physical Exam  Nursing note and vitals reviewed. Constitutional: She is oriented to person, place, and time. She appears well-developed and well-nourished. No distress.  HENT:  Head: Normocephalic.  Cardiovascular: Normal rate and regular rhythm.   Pulmonary/Chest: Effort normal and breath sounds normal. No respiratory distress. She has no wheezes.  Musculoskeletal: She exhibits edema.  There is swelling of bilateral lower extremities slightly worse on the right side. This is pitting edema to the midshin. Patient also has dry irritated skin to the plantar service of the right foot. Skin generally appears well on the left side.  No erythema or increased warmth.  Neurological: She is alert and oriented to person, place, and time.  Skin: Skin is warm and dry. No rash noted.  Psychiatric: She has a normal mood and affect. Her behavior is normal.    ED Course  Procedures   COORDINATION OF CARE:  Nursing notes reviewed. Vital signs reviewed. Initial pt interview and examination performed.   Filed Vitals:   07/04/14 0108 07/04/14 0259  BP: 130/89 126/84  Pulse: 78 64  Temp: 98.9 F (37.2 C)   Resp: 20 16  SpO2: 99% 100%    5:15 AM-patient seen and evaluated. She has had chronic ongoing waxing and waning symptoms. Do not suspect any DVT  or other concerning causes swelling and pain at this time. Does have signs of tenia pedis. There may also be components of plantar fasciitis. We'll plan to recommend antifungal ointment cream and give dietary referral. Patient agrees with plan.   Treatment plan initiated: Medications  ibuprofen (ADVIL,MOTRIN) tablet 800 mg (not administered)     Imaging Review Dg Ankle Complete Right  07/04/2014   CLINICAL DATA:  Dorsal sided foot painRemote injury (2 years prior).  EXAM: RIGHT ANKLE - COMPLETE 3+ VIEW  COMPARISON:  None.  FINDINGS: There is no evidence of fracture, dislocation, or joint effusion. There is no evidence of arthropathy or other focal bone abnormality. Incidental plantar calcaneal spur.  IMPRESSION: Negative.   Electronically Signed   By: Tiburcio Pea M.D.   On: 07/04/2014 04:05   Dg Foot Complete Right  07/04/2014   CLINICAL DATA:  Dorsal sided foot pain.  EXAM: RIGHT FOOT COMPLETE - 3+ VIEW  COMPARISON:  None.  FINDINGS: There is no evidence of fracture or dislocation. There is no evidence of arthropathy or other focal bone abnormality. Bony bunion formation present at the first metatarsal.  IMPRESSION: Negative.   Electronically Signed   By: Tiburcio Pea M.D.   On: 07/04/2014 04:06    MDM   Final diagnoses:  Tinea pedis of right foot        Angus Seller, PA-C 07/04/14 [phone]

## 2014-07-04 NOTE — ED Notes (Signed)
Pt monitored by pulse ox and bp cuff. 

## 2014-07-04 NOTE — ED Notes (Signed)
Pt reports rt ankle and foot pain that has been bothering her for months. Pt states it gets worse when she is at work, she stands on her feet for hours. Pt has been using compression socks and advil at times for pain with no relief. Pt describes pain as sharp shooting pain in her foot. Pt rates pain 9/10 currently.

## 2014-07-08 ENCOUNTER — Ambulatory Visit (INDEPENDENT_AMBULATORY_CARE_PROVIDER_SITE_OTHER): Payer: 59 | Admitting: Podiatry

## 2014-07-08 ENCOUNTER — Encounter: Payer: Self-pay | Admitting: Podiatry

## 2014-07-08 VITALS — BP 140/91 | HR 89 | Ht 72.0 in | Wt 255.0 lb

## 2014-07-08 DIAGNOSIS — M7750 Other enthesopathy of unspecified foot: Secondary | ICD-10-CM

## 2014-07-08 DIAGNOSIS — M79609 Pain in unspecified limb: Secondary | ICD-10-CM

## 2014-07-08 DIAGNOSIS — M21969 Unspecified acquired deformity of unspecified lower leg: Secondary | ICD-10-CM

## 2014-07-08 DIAGNOSIS — M7741 Metatarsalgia, right foot: Secondary | ICD-10-CM

## 2014-07-08 DIAGNOSIS — M216X9 Other acquired deformities of unspecified foot: Secondary | ICD-10-CM

## 2014-07-08 NOTE — Patient Instructions (Signed)
Seen for pain in right foot. Ankle brace placed in right foot. 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

## 2014-07-08 NOTE — Progress Notes (Signed)
Subjective: 38 year old female presents complaining of pain at plantar lateral heel, lateral ankle and lateral column of fore foot right foot x over 6 months. Work in Hospital doctorconcrete floor.  Wears regular tennis shoes. Has had history of twisting ankle right foot a year ago without residual pain. On feet 9 hours with three breaks.  Patient is wearing flat sandals.  Objective: Normotrophic skin.  Positive of mild edema right lateral ankle. No discoloration. Neurovascular status are within normal. Orthopedic: Hypermobile first ray bilateral. HAV with bunion bilateral. STJ hyperpronation bilateral.  Assessment: Flexible flat foot. STJ hyperpronation with lateral weight shifting. HAV with bunion. Hypermobile first ray bilateral.  Plan: Reviewed findings and available treatment options.  Advised to wear lace up shoes and orthotics. Ankle brace dispensed for right foot. Will prepare orthotics on next visit. Reviewed findings.

## 2014-07-13 ENCOUNTER — Encounter: Payer: Self-pay | Admitting: Podiatry

## 2014-07-13 ENCOUNTER — Ambulatory Visit (INDEPENDENT_AMBULATORY_CARE_PROVIDER_SITE_OTHER): Payer: 59 | Admitting: Podiatry

## 2014-07-13 VITALS — BP 157/101 | HR 88

## 2014-07-13 DIAGNOSIS — M79604 Pain in right leg: Secondary | ICD-10-CM

## 2014-07-13 DIAGNOSIS — M21969 Unspecified acquired deformity of unspecified lower leg: Secondary | ICD-10-CM

## 2014-07-13 DIAGNOSIS — M7750 Other enthesopathy of unspecified foot: Secondary | ICD-10-CM

## 2014-07-13 DIAGNOSIS — M79606 Pain in leg, unspecified: Secondary | ICD-10-CM | POA: Insufficient documentation

## 2014-07-13 DIAGNOSIS — M79609 Pain in unspecified limb: Secondary | ICD-10-CM

## 2014-07-13 DIAGNOSIS — M7741 Metatarsalgia, right foot: Secondary | ICD-10-CM

## 2014-07-13 DIAGNOSIS — M216X9 Other acquired deformities of unspecified foot: Secondary | ICD-10-CM

## 2014-07-13 NOTE — Progress Notes (Signed)
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  Ankle brace helped a lot.   Objective:  Normotrophic skin.  Positive of mild edema right lateral ankle.  No discoloration.  Neurovascular status are within normal.  Orthopedic: Hypermobile first ray bilateral. HAV with bunion bilateral. STJ hyperpronation bilateral.   Radiographic examination reveal enlarged medial eminence of the first Metatarsal bone, increased lateral deviation angle of the Calcaneocuboid angle, positive of plantar calcaneal spur and elevated first ray bilateral.   Assessment: Flexible flat foot.  STJ hyperpronation with lateral weight shifting.  HAV with bunion.  Hypermobile first ray bilateral.  Elevated first ray bilatereal. Doing well with Ankle brace.  Plan:  Continue with Ankle brace dispensed for right foot.  Both feet casted for orthotics.

## 2014-07-13 NOTE — Patient Instructions (Addendum)
Casted for Orthotics. Will contact patient when they are ready.  Continue with ankle brace as needed.

## 2014-08-08 ENCOUNTER — Encounter (HOSPITAL_COMMUNITY): Payer: Self-pay | Admitting: Emergency Medicine

## 2014-08-08 ENCOUNTER — Emergency Department (HOSPITAL_COMMUNITY)
Admission: EM | Admit: 2014-08-08 | Discharge: 2014-08-08 | Disposition: A | Discharge status: Home / Self Care | Payer: 59 | Attending: Emergency Medicine | Admitting: Emergency Medicine

## 2014-08-08 DIAGNOSIS — Z760 Encounter for issue of repeat prescription: Secondary | ICD-10-CM | POA: Diagnosis not present

## 2014-08-08 DIAGNOSIS — Z8619 Personal history of other infectious and parasitic diseases: Secondary | ICD-10-CM | POA: Diagnosis not present

## 2014-08-08 DIAGNOSIS — Z79899 Other long term (current) drug therapy: Secondary | ICD-10-CM | POA: Insufficient documentation

## 2014-08-08 DIAGNOSIS — R51 Headache: Secondary | ICD-10-CM | POA: Diagnosis present

## 2014-08-08 DIAGNOSIS — F17200 Nicotine dependence, unspecified, uncomplicated: Secondary | ICD-10-CM | POA: Insufficient documentation

## 2014-08-08 DIAGNOSIS — I10 Essential (primary) hypertension: Secondary | ICD-10-CM

## 2014-08-08 MED ORDER — LOSARTAN POTASSIUM 50 MG PO TABS: 50.0000 mg | ORAL_TABLET | Freq: Every day | ORAL | Status: DC

## 2014-08-08 NOTE — ED Notes (Signed)
avs explained in detail, no other questions/concerns. Bus pass given.

## 2014-08-08 NOTE — Discharge Instructions (Signed)
Return here as needed.    Emergency Department Resource Guide 1) Find a Doctor and Pay Out of Pocket Although you won't have to find out who is covered by your insurance plan, it is a good idea to ask around and get recommendations. You will then need to call the office and see if the doctor you have chosen will accept you as a new patient and what types of options they offer for patients who are self-pay. Some doctors offer discounts or will set up payment plans for their patients who do not have insurance, but you will need to ask so you aren't surprised when you get to your appointment.  2) Contact Your Local Health Department Not all health departments have doctors that can see patients for sick visits, but many do, so it is worth a call to see if yours does. If you don't know where your local health department is, you can check in your phone book. The CDC also has a tool to help you locate your state's health department, and many state websites also have listings of all of their local health departments.  3) Find a Walk-in Clinic If your illness is not likely to be very severe or complicated, you may want to try a walk in clinic. These are popping up all over the country in pharmacies, drugstores, and shopping centers. They're usually staffed by nurse practitioners or physician assistants that have been trained to treat common illnesses and complaints. They're usually fairly quick and inexpensive. However, if you have serious medical issues or chronic medical problems, these are probably not your best option.  No Primary Care Doctor: - Call Health Connect at  [phone] - they can help you locate a primary care doctor that  accepts your insurance, provides certain services, etc. - Physician Referral Service- [phone]  Chronic Pain Problems: Organization         Address  Phone   Notes  [facility]  [phone] Patients need to be referred by their primary care  doctor.   Medication Assistance: Organization         Address  Phone   Notes  [facility] Medication [facility] [address] [place] [place], [place] [address] [phone] --Must be a resident of [facility] -- Must have NO insurance coverage whatsoever (no Medicaid/ Medicare, etc.) -- The pt. MUST have a primary care doctor that directs their care regularly and follows them in the community   [facility]ist  [phone]   [employer]  [phone]    Agencies that provide inexpensive medical care: Organization         Address  Phone   Notes  [facility]  [phone]   [facility]    [phone]   [facility] [address] [place], [place] [address] [phone]   [facility] [address] [place], [place] [phone]   Planned Parenthood    [phone]   [facility]    [phone]   [facility] Health and [facility]  [address], [place] Phone:  [phone], Fax:  [phone] Hours of Operation:  9 am - 6 pm, M-F.  Also accepts Medicaid/Medicare and self-pay.  [facility]  [address], [place] Phone: [phone], Fax: [phone]. Hours of Operation:  8:30 am - 5:30 pm, M-F.  Also accepts Medicaid  and self-pay.  [facility] [place] [address], [place] Phone: [phone]) [phone]   [facility] [address], [place], [place] [phone], Ext. [phone] Mondays & Thursdays: 7-9 AM.  First 15 patients are seen on a first come, first serve basis.    Medicaid-accepting [facility] Providers:  Organization         Address  Phone   Notes  [facility] [address], [place] [phone]) [phone] Also accepts self-pay patients.  [facility] [address], [place]  [phone]) [phone]   [facility] [address], [place] [phone]   [facility] [address] [place] [address], [place] [phone]) [phone]   Renaye RakersVeita Bland [address], [place]   [phone] Only accepts [place] Access [place] patients after they have their name applied to their card.   Self-Pay (no insurance) in [facility]:  Organization         Address  Phone   Notes  Sickle Cell Patients, [facility] [address], [place] [phone](336) [phone]   [facility] [address], [place] [phone]   [facility] [place]  [address], [place] [phone]   [facility]/Dr. Osei-Bonsu  [address] [place] [address], [place] or [phone], [place] [phone] Phone number for both [place] and [place] locations is the same.  [facility] [address], [place] [phone]   [facility] [address] [place] [address], [place] or [address] [phone]   [facility] [address], [place] [phone]) [phone], phone; [phone]) [phone], fax Sees patients 1st and 3rd Saturday of every month.  Must not qualify for public or private insurance (i.e. Medicaid, Medicare, [place] Health Choice, Veterans' Benefits)  Household income should be no more than 200% of the poverty level The clinic cannot treat you if you are pregnant or think you are pregnant  Sexually transmitted diseases are not treated at the clinic.    Dental Care: Organization         Address  Phone  Notes  [facility] [address], [place] [phone] Accepts children up to age 40 who are enrolled in [place] or [place] Health Choice; pregnant women with a Medicaid card; and children who have applied for Medicaid or [place] Health Choice, but were declined, whose parents can pay a reduced fee at time of  service.  [facility] [place]  [address], [place] [phone] Accepts children up to age 40 who are enrolled in [place] or [place] Health Choice; pregnant women with a Medicaid card; and children who have applied for Medicaid or [place] Health Choice, but were declined, whose parents can pay a reduced fee at time of service.  [facility]  [address], [place] [phone] Patients are seen by appointment only. Walk-ins are not accepted. [facility] will see patients 40 years of age and older. Monday - Tuesday (8am-5pm) Most Wednesdays (8:30-5pm) $30 per visit, cash only  [facility]  [address], [facility] [place] [phone] Patients are seen by appointment only. Walk-ins are not accepted. [facility] will see patients  52 years of age and older. One Wednesday Evening (Monthly: Volunteer Based).  $30 per visit, cash only  [employer]  [phone] for adults; Children under age 64, call Graduate Pediatric Dentistry at [phone]. Children aged 62-14, please call [phone] to request a pediatric application.  Dental services are provided in all areas of dental care including fillings, crowns and bridges, complete and partial dentures, implants, gum treatment, root canals, and extractions. Preventive care is also provided. Treatment is provided to both adults and children. Patients are selected via a lottery and there is often a waiting list.   [facility] [address], Start  [phone] [email or web address]   [facility] [address] [place], [place] [phone], Ext. [phone] Second and Fourth Thursday of each month, opens at 6:30 AM; Clinic ends at 9 AM.  Patients are seen on a first-come first-served basis, and a limited number are seen during each clinic.   [facility]  [address] [place],  [place] [phone]   Eligibility Requirements You must have lived in [place], [place], or [place] counties for at least the last three months.   You cannot be eligible for state or federal sponsored [place], including [employer], [place], or [employer].   You generally cannot be eligible for healthcare insurance through your employer.    How to apply: Eligibility screenings are held every Tuesday and Wednesday afternoon from 1:00 pm until 4:00 pm. You do not need an appointment for the interview!  [facility] [address], [place], [place] [phone]   [facility]  [phone]   [facility] Health Department  [phone]   [facility] Health Department  [phone]    Behavioral Health Resources in the Community: Intensive Outpatient Programs Organization         Address  Phone  Notes  [facility] [address] [place], [place] [phone]   [facility] Outpatient [address] [place], [place] [phone]   [facility] [address], [place], [place]  [phone]   [facility] Mental Health [address]  [place], [place] [phone] or [phone]   Substance Abuse Resources Organization         Address  Phone  Notes  [facility] Drug Services  [phone]   [facility]  3526049369   [facility]  [phone]   Floydene Flock  ([phone]   Residential & Outpatient Substance Abuse Program  [phone]   Psychological Services Organization         Address  Phone  Notes  [facility]  [phone]   [facility] Services  [phone]   [facility] Mental Health [address], [place] [phone] or [phone]    Mobile Crisis Teams Organization         Address  Phone  Notes  [facility], Mobile Crisis Care Unit  [phone]   [facility]  [address] [place], [place] [phone]   Doristine Locks [address] [place] [phone]    Self-Help/Support Groups Organization         Address  Phone             Notes  [facility] [place] - variety of support groups  336- I7437963 Call for more information  Narcotics Anonymous (NA), [facility] [address], [employer]  [place]  2 meetings at this location   Residential Treatment Programs Organization         Address  Phone  Notes  [facility] [address],    [place]  [phone]   [facility]  [address], [place] [address], [place], [place] [phone]   [facility] Treatment Facility [address] [place], [place] [phone] Admissions: 8am-3pm M-F  [facility] [address],    [place], [place] [phone]   [facility] [address] [place], [place], [place] [phone]   [facility] [address]  [place], [place] [phone]   [facility] - Intensive Outpatient [address], [place], [place] [phone]   [facility] [address] [place]  [place], [place] [phone] or [phone]   [facility] ([facility]) [address], [place], [place] [phone] Accepts Medicaid  [facility] [address],  [place] [phone] Substance Abuse/Addiction Treatment   [facility] Organization         Address  Phone  Notes  [facility]  [phone]   Angie Fava, PhD [address] [place], [place]   [phone] or 6192049386   [facility]al   [address] [place], [place] [phone]   [facility] [address], [place], [place] [phone] Insurance/Medicaid/sponsorship through [facility] [address]                                    [place], [place] [phone]  Therapy/tele-psych/case  [facility] [address], [place] [phone]    Dr. Lolly Mustache  [phone]   [facility]  [employer] [facility] Dept. 1) [address], [place] 2) [address] [place] 3)  [address], [place] [phone]  [phone]   [facility] Child Abuse Hotline (647)478-4448 or [phone] (After Hours)

## 2014-08-08 NOTE — ED Provider Notes (Signed)
CSN: [id number]     Arrival date & time 08/08/14  1806 History   None   This chart was scribed for non-physician practitioner Charlestine Night, PA-C working with Brittany Mo, MD, by Andrew Au, ED Scribe. This patient was seen in room WTR7/WTR7 and the patient's care was started at 6:22 PM.  Chief Complaint  Patient presents with  . Hypertension  . Headache   Patient is a 38 y.o. female presenting with hypertension and headaches. The history is provided by the patient. No language interpreter was used.  Hypertension Associated symptoms include headaches.  Headache  Brittany Duarte is a 38 y.o. female who presents to the Emergency Department complaining of HTN with associated HA.  Pt reports she has not taken BP medication in 5 days due to her PCP not calling in her prescription. Pt is taking losarten 50 mg and vitamins. Pt denies being allergic to medication.  Pt is currently looking for a new PCP.   PCPJackie Plum, MD   Past Medical History  Diagnosis Date  . Hep C w/o coma, chronic   . Hypertension   . Tuberculosis of lung, nodular 1999  . Syphilis    Past Surgical History  Procedure Laterality Date  . Appendectomy    . Tubal ligation    . Examination under anesthesia  12/15/2011    Procedure: EXAM UNDER ANESTHESIA;  Surgeon: Rulon Abide, DO;  Location: [facility];  Service: General;  Laterality: N/A;  incison and drainage of peri rectal abcess  . Rectal surgery     History reviewed. No pertinent family history. History  Substance Use Topics  . Smoking status: Current Some Day Smoker  . Smokeless tobacco: Never Used  . Alcohol Use: No   OB History   Grav Para Term Preterm Abortions TAB SAB Ect Mult Living                 Review of Systems  Neurological: Positive for headaches.    Allergies  Tylenol  Home Medications   Prior to Admission medications   Medication Sig Start Date End Date Taking? Authorizing Provider  losartan-hydrochlorothiazide  (HYZAAR) 50-12.5 MG per tablet Take 1 tablet by mouth daily.    Historical Provider, MD  Multiple Vitamin (MULTIVITAMIN WITH MINERALS) TABS tablet Take 1 tablet by mouth daily.    Historical Provider, MD  Naproxen Sodium (ALEVE PO) Take 2 tablets by mouth daily as needed (for pain).    Historical Provider, MD  terbinafine (LAMISIL AT) 1 % cream Apply 1 application topically 2 (two) times daily. 07/04/14   Phill Mutter Dammen, PA-C   BP 136/87  Pulse 83  Temp(Src) 98.5 F (36.9 C) (Oral)  Resp 16  SpO2 98% Physical Exam  Nursing note and vitals reviewed. Constitutional: She is oriented to person, place, and time. She appears well-developed and well-nourished. No distress.  HENT:  Head: Normocephalic and atraumatic.  Mouth/Throat: Oropharynx is clear and moist.  Eyes: Conjunctivae are normal. Pupils are equal, round, and reactive to light.  Cardiovascular: Normal rate, regular rhythm and normal heart sounds.  Exam reveals no gallop and no friction rub.   No murmur heard. Pulmonary/Chest: Effort normal and breath sounds normal.  Musculoskeletal: Normal range of motion. She exhibits no edema.  Neurological: She is alert and oriented to person, place, and time.  Skin: Skin is warm and dry. No rash noted. No erythema.  Psychiatric: She has a normal mood and affect. Her behavior is normal.   ED Course  Procedures (including critical care time) DIAGNOSTIC STUDIES: Oxygen Saturation is 98% on RA, normal by my interpretation.    COORDINATION OF CARE: 6:19 PM- Pt advised of plan for treatment and pt agrees.   I personally performed the services described in this documentation, which was scribed in my presence. The recorded information has been reviewed and is accurate.      Carlyle Dolly, PA-C 08/08/14 [place] Fruit

## 2014-08-08 NOTE — ED Notes (Addendum)
Pt reports her doctor did not call in her prescription for blood pressure medicine, so she has not taken her medication in 5 days. Reports headache 8/10. Denies n/v/d.

## 2014-08-08 NOTE — ED Notes (Signed)
PA contacted SW. SW will bring patient a list of resources for PCP for her insurance coverage.

## 2014-08-08 NOTE — ED Provider Notes (Signed)
Medical screening examination/treatment/procedure(s) were performed by non-physician practitioner and as supervising physician I was immediately available for consultation/collaboration.   EKG Interpretation None        Matthew Gentry, MD 08/08/14 2346 

## 2014-08-08 NOTE — Progress Notes (Signed)
  CARE MANAGEMENT ED NOTE 08/08/2014  Patient:  KENZI, BARDWELL   Account Number:  [id number]  Date Initiated:  08/08/2014  Documentation initiated by:  Radford Pax  Subjective/Objective Assessment:   Patient presents to Ed with HTN with headache     Subjective/Objective Assessment Detail:     Action/Plan:   Action/Plan Detail:   Anticipated DC Date:  08/08/2014     Status Recommendation to Physician:   Result of Recommendation:    Other ED Services  Consult Working Plan    DC Planning Services  CM consult  Other  PCP issues    Choice offered to / List presented to:            Status of service:  Completed, signed off  ED Comments:   ED Comments Detail:  [facility] consulted by EDPA who reports patient is not satisfied with her current pcp, requesting list of pcps who accept [place] [employer] care insurance.  [facility] provided patient with printed list of pcps who accept [employer] insuranvce within a 10 mile radius of patient's zip code [address].  Patient thankful for services.  No further EDCM needs at this time.

## 2014-08-14 ENCOUNTER — Emergency Department (HOSPITAL_COMMUNITY)
Admission: EM | Admit: 2014-08-14 | Discharge: 2014-08-14 | Disposition: A | Discharge status: Home / Self Care | Payer: 59 | Attending: Emergency Medicine | Admitting: Emergency Medicine

## 2014-08-14 ENCOUNTER — Encounter (HOSPITAL_COMMUNITY): Payer: Self-pay | Admitting: Emergency Medicine

## 2014-08-14 DIAGNOSIS — A499 Bacterial infection, unspecified: Secondary | ICD-10-CM | POA: Diagnosis not present

## 2014-08-14 DIAGNOSIS — N760 Acute vaginitis: Secondary | ICD-10-CM | POA: Diagnosis not present

## 2014-08-14 DIAGNOSIS — Z3202 Encounter for pregnancy test, result negative: Secondary | ICD-10-CM | POA: Diagnosis not present

## 2014-08-14 DIAGNOSIS — N949 Unspecified condition associated with female genital organs and menstrual cycle: Secondary | ICD-10-CM | POA: Diagnosis present

## 2014-08-14 DIAGNOSIS — Z79899 Other long term (current) drug therapy: Secondary | ICD-10-CM | POA: Diagnosis not present

## 2014-08-14 DIAGNOSIS — B9689 Other specified bacterial agents as the cause of diseases classified elsewhere: Secondary | ICD-10-CM | POA: Insufficient documentation

## 2014-08-14 DIAGNOSIS — F17200 Nicotine dependence, unspecified, uncomplicated: Secondary | ICD-10-CM | POA: Insufficient documentation

## 2014-08-14 DIAGNOSIS — I10 Essential (primary) hypertension: Secondary | ICD-10-CM | POA: Diagnosis not present

## 2014-08-14 DIAGNOSIS — Z792 Long term (current) use of antibiotics: Secondary | ICD-10-CM | POA: Insufficient documentation

## 2014-08-14 DIAGNOSIS — Z202 Contact with and (suspected) exposure to infections with a predominantly sexual mode of transmission: Secondary | ICD-10-CM | POA: Diagnosis not present

## 2014-08-14 LAB — URINALYSIS, ROUTINE W REFLEX MICROSCOPIC
Bilirubin Urine: NEGATIVE
GLUCOSE, UA: NEGATIVE mg/dL
Hgb urine dipstick: NEGATIVE
Ketones, ur: NEGATIVE mg/dL
LEUKOCYTES UA: NEGATIVE
Nitrite: NEGATIVE
Protein, ur: NEGATIVE mg/dL
Specific Gravity, Urine: 1.016 (ref 1.005–1.030)
Urobilinogen, UA: 1 mg/dL (ref 0.0–1.0)
pH: 6.5 (ref 5.0–8.0)

## 2014-08-14 LAB — WET PREP, GENITAL
Trich, Wet Prep: NONE SEEN
YEAST WET PREP: NONE SEEN

## 2014-08-14 LAB — POC URINE PREG, ED: PREG TEST UR: NEGATIVE

## 2014-08-14 MED ORDER — AZITHROMYCIN 250 MG PO TABS
1000.0000 mg | ORAL_TABLET | Freq: Once | ORAL | Status: AC
  Administered 2014-08-14: 1000 mg via ORAL
  Filled 2014-08-14: qty 4

## 2014-08-14 MED ORDER — ONDANSETRON 4 MG PO TBDP
4.0000 mg | ORAL_TABLET | Freq: Once | ORAL | Status: AC
  Administered 2014-08-14: 4 mg via ORAL
  Filled 2014-08-14: qty 1

## 2014-08-14 MED ORDER — METRONIDAZOLE 500 MG PO TABS: 2000.0000 mg | ORAL_TABLET | Freq: Once | ORAL | Status: DC

## 2014-08-14 MED ORDER — METRONIDAZOLE 500 MG PO TABS: 500.0000 mg | ORAL_TABLET | Freq: Two times a day (BID) | ORAL | Status: DC

## 2014-08-14 MED ORDER — CEFTRIAXONE SODIUM 250 MG IJ SOLR
250.0000 mg | Freq: Once | INTRAMUSCULAR | Status: AC
  Administered 2014-08-14: 250 mg via INTRAMUSCULAR
  Filled 2014-08-14: qty 250

## 2014-08-14 NOTE — Discharge Instructions (Signed)
Bacterial Vaginosis Bacterial vaginosis is a vaginal infection that occurs when the normal balance of bacteria in the vagina is disrupted. It results from an overgrowth of certain bacteria. This is the most common vaginal infection in women of childbearing age. Treatment is important to prevent complications, especially in pregnant women, as it can cause a premature delivery. CAUSES  Bacterial vaginosis is caused by an increase in harmful bacteria that are normally present in smaller amounts in the vagina. Several different kinds of bacteria can cause bacterial vaginosis. However, the reason that the condition develops is not fully understood. RISK FACTORS Certain activities or behaviors can put you at an increased risk of developing bacterial vaginosis, including:  Having a new sex partner or multiple sex partners.  Douching.  Using an intrauterine device (IUD) for contraception. Women do not get bacterial vaginosis from toilet seats, bedding, swimming pools, or contact with objects around them. SIGNS AND SYMPTOMS  Some women with bacterial vaginosis have no signs or symptoms. Common symptoms include:  Grey vaginal discharge.  A fishlike odor with discharge, especially after sexual intercourse.  Itching or burning of the vagina and vulva.  Burning or pain with urination. DIAGNOSIS  Your health care provider will take a medical history and examine the vagina for signs of bacterial vaginosis. A sample of vaginal fluid may be taken. Your health care provider will look at this sample under a microscope to check for bacteria and abnormal cells. A vaginal pH test may also be done.  TREATMENT  Bacterial vaginosis may be treated with antibiotic medicines. These may be given in the form of a pill or a vaginal cream. A second round of antibiotics may be prescribed if the condition comes back after treatment.  HOME CARE INSTRUCTIONS   Only take over-the-counter or prescription medicines as  directed by your health care provider.  If antibiotic medicine was prescribed, take it as directed. Make sure you finish it even if you start to feel better.  Do not have sex until treatment is completed.  Tell all sexual partners that you have a vaginal infection. They should see their health care provider and be treated if they have problems, such as a mild rash or itching.  Practice safe sex by using condoms and only having one sex partner. SEEK MEDICAL CARE IF:   Your symptoms are not improving after 3 days of treatment.  You have increased discharge or pain.  You have a fever. MAKE SURE YOU:   Understand these instructions.  Will watch your condition.  Will get help right away if you are not doing well or get worse. FOR MORE INFORMATION  Centers for Disease Control and Prevention, Division of STD Prevention: www.cdc.gov/std American Sexual Health Association (ASHA): www.ashastd.org  Document Released: 11/18/2005 Document Revised: 09/08/2013 Document Reviewed: 06/30/2013 ExitCare Patient Information 2015 [employer]. This information is not intended to replace advice given to you by your health care provider. Make sure you discuss any questions you have with your health care provider.  

## 2014-08-14 NOTE — ED Provider Notes (Signed)
CSN: [id number]     Arrival date & time 08/14/14  1522 History   First MD Initiated Contact with Patient 08/14/14 1549     Chief Complaint  Patient presents with  . Vaginal Pain  . SEXUALLY TRANSMITTED DISEASE     (Consider location/radiation/quality/duration/timing/severity/associated sxs/prior Treatment) HPI  Patient to the ER with complaints vaginal pain and dysuria for the past 3 days. She has noticed some small bumps on vaginal lips, pt is afraid its herpes. Says she is supposed to get married soon and slept with another guy last week but the condom broke. The pain is only when she urinates and wipes herself. No abdominal pain, n/v/d, or fevers.   Past Medical History  Diagnosis Date  . Hep C w/o coma, chronic   . Hypertension   . Tuberculosis of lung, nodular 1999  . Syphilis    Past Surgical History  Procedure Laterality Date  . Appendectomy    . Tubal ligation    . Examination under anesthesia  12/15/2011    Procedure: EXAM UNDER ANESTHESIA;  Surgeon: Rulon Abide, DO;  Location: [facility];  Service: General;  Laterality: N/A;  incison and drainage of peri rectal abcess  . Rectal surgery     History reviewed. No pertinent family history. History  Substance Use Topics  . Smoking status: Current Some Day Smoker  . Smokeless tobacco: Never Used  . Alcohol Use: No   OB History   Grav Para Term Preterm Abortions TAB SAB Ect Mult Living                 Review of Systems  All other systems reviewed and are negative.     Allergies  Tylenol  Home Medications   Prior to Admission medications   Medication Sig Start Date End Date Taking? Authorizing Provider  losartan (COZAAR) 50 MG tablet Take 1 tablet (50 mg total) by mouth daily. 08/08/14  Yes Jamesetta Orleans Lawyer, PA-C  Multiple Vitamin (MULTIVITAMIN WITH MINERALS) TABS tablet Take 1 tablet by mouth daily.   Yes Historical Provider, MD  Naproxen Sodium (ALEVE PO) Take 2 tablets by mouth daily as needed (for  pain).   Yes Historical Provider, MD  metroNIDAZOLE (FLAGYL) 500 MG tablet Take 1 tablet (500 mg total) by mouth 2 (two) times daily. 08/14/14   Dalynn Jhaveri Irine Seal, PA-C   BP 137/85  Pulse 90  Temp(Src) 98.2 F (36.8 C) (Oral)  Resp 16  SpO2 100% Physical Exam  Nursing note and vitals reviewed. Constitutional: She appears well-developed and well-nourished. No distress.  HENT:  Head: Normocephalic and atraumatic.  Eyes: Pupils are equal, round, and reactive to light.  Neck: Normal range of motion. Neck supple.  Cardiovascular: Normal rate and regular rhythm.   Pulmonary/Chest: Effort normal.  Abdominal: Soft.  Genitourinary: Uterus normal.    There is tenderness on the right labia. There is tenderness on the left labia. Cervix exhibits discharge. Cervix exhibits no motion tenderness and no friability. Right adnexum displays no mass and no tenderness. Left adnexum displays no mass and no tenderness. Vaginal discharge found.  Neurological: She is alert.  Skin: Skin is warm and dry.    ED Course  Procedures (including critical care time) Labs Review Labs Reviewed  WET PREP, GENITAL - Abnormal; Notable for the following:    Clue Cells Wet Prep HPF POC MANY (*)    WBC, Wet Prep HPF POC FEW (*)    All other components within normal limits  URINALYSIS, ROUTINE  W REFLEX MICROSCOPIC - Abnormal; Notable for the following:    APPearance CLOUDY (*)    All other components within normal limits  GC/CHLAMYDIA PROBE AMP  POC URINE PREG, ED    Imaging Review No results found.   EKG Interpretation None      MDM   Final diagnoses:  Bacterial vaginosis    She had a condom burst and slept with an unfamiliar partner. WBC on wet prep are only few but considering will cover with Rocephin and Azithromycin. She is positive for BV, will give  flagyl BID for 7 days. Negative urine preg. No sign of UTI. No abdominal pain or systemic symptoms.  38 y.o.Brittany Duarte's evaluation in the  Emergency Department is complete. It has been determined that no acute conditions requiring further emergency intervention are present at this time. The patient/guardian have been advised of the diagnosis and plan. We have discussed signs and symptoms that warrant return to the ED, such as changes or worsening in symptoms.  Vital signs are stable at discharge. Filed Vitals:   08/14/14 1538  BP: 137/85  Pulse: 90  Temp: 98.2 F (36.8 C)  Resp: 16    Patient/guardian has voiced understanding and agreed to follow-up with the PCP or specialist.     Dorthula Matas, PA-C 08/14/14 1733

## 2014-08-14 NOTE — ED Notes (Signed)
Pt reports she has been having vaginal pain and dysuria x 3 days, today noticed small bumps all over vagina. Pt is afraid it is herpes. Pt reports only pain when urinating and cleaning herself. Pain 4/10. Reports condom broke during sexual intercourse 1 week ago. Denies abdominal pain.

## 2014-08-14 NOTE — ED Notes (Signed)
Pt escorted to discharge window. Pt verbalized understanding discharge instructions. In no acute distress.  

## 2014-08-14 NOTE — ED Provider Notes (Signed)
Medical screening examination/treatment/procedure(s) were performed by non-physician practitioner and as supervising physician I was immediately available for consultation/collaboration.   EKG Interpretation None       Linwood Dibbles, MD 08/14/14 1740

## 2014-08-15 LAB — GC/CHLAMYDIA PROBE AMP
CT Probe RNA: NEGATIVE
GC PROBE AMP APTIMA: NEGATIVE

## 2014-08-26 ENCOUNTER — Ambulatory Visit (INDEPENDENT_AMBULATORY_CARE_PROVIDER_SITE_OTHER): Payer: 59 | Admitting: Podiatry

## 2014-08-26 ENCOUNTER — Encounter: Payer: Self-pay | Admitting: Podiatry

## 2014-08-26 DIAGNOSIS — M21969 Unspecified acquired deformity of unspecified lower leg: Secondary | ICD-10-CM

## 2014-08-26 NOTE — Progress Notes (Signed)
One month orthotic check. Stated that the orthotics are doing good except heel posting is making her back hurt and wanted to be removed. Will send orthotics back to lab and remove external heel post.

## 2014-08-26 NOTE — Patient Instructions (Signed)
Will return orthotics and remove external heel lift.

## 2014-08-31 ENCOUNTER — Other Ambulatory Visit: Payer: Self-pay | Admitting: Internal Medicine

## 2014-08-31 DIAGNOSIS — R1084 Generalized abdominal pain: Secondary | ICD-10-CM

## 2014-09-08 ENCOUNTER — Other Ambulatory Visit: Payer: 59

## 2014-09-14 ENCOUNTER — Ambulatory Visit: Payer: 59 | Admitting: Podiatry

## 2014-10-12 ENCOUNTER — Ambulatory Visit: Payer: 59 | Admitting: Podiatry

## 2014-10-21 ENCOUNTER — Ambulatory Visit (INDEPENDENT_AMBULATORY_CARE_PROVIDER_SITE_OTHER): Payer: 59 | Admitting: Physician Assistant

## 2014-10-21 VITALS — BP 116/88 | HR 79 | Temp 98.5°F | Resp 18 | Ht 72.5 in | Wt 250.0 lb

## 2014-10-21 DIAGNOSIS — Z720 Tobacco use: Secondary | ICD-10-CM

## 2014-10-21 DIAGNOSIS — Z716 Tobacco abuse counseling: Secondary | ICD-10-CM

## 2014-10-21 DIAGNOSIS — R079 Chest pain, unspecified: Secondary | ICD-10-CM

## 2014-10-21 NOTE — Progress Notes (Signed)
Subjective:    Patient ID: Brittany Duarte, female    DOB: 11/11/1976, 38 y.o.   MRN: [id number]  HPI Patient presents for chest pain that occurred last night. Has happened 3-4x/month for the past 2 months. Pain is sharp, 8/10, last for 2 min, and may radiate to jaw. Feels better when she sits straight up. Usually feels pain when she is smoking. Has been a smoker since 38 years old (26 years). Has quit before currently has been trying to quit for the past 6 months, but has not been successful. Uses gum and patch. 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 PMH of HTN and is overweight.  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   Does not exercise, but plans of starting with trainer this month. Is not dieting, but would like to.  PFSH reviewed for this encounter.   Review of Systems  Constitutional: Negative for fever, chills, diaphoresis, activity change and fatigue.  Respiratory: Negative for cough, chest tightness and shortness of breath.   Cardiovascular: Positive for chest pain. Negative for palpitations and leg swelling.  Gastrointestinal: Negative for nausea, vomiting and abdominal pain.  Musculoskeletal: Negative for myalgias, neck pain and neck stiffness.  Allergic/Immunologic: Negative for environmental allergies and food allergies.  Neurological: Negative for dizziness, light-headedness and headaches.  Psychiatric/Behavioral: Negative for behavioral problems, dysphoric mood and decreased concentration. The patient is not nervous/anxious.        Objective:   Physical Exam  Constitutional: She is oriented to person, place, and time. She appears well-developed and well-nourished. No distress.  Blood pressure 116/88, pulse 79, temperature 98.5 F (36.9 C), resp. rate 18, height 6' 0.5" (1.842 m), weight 250 lb (113.399 kg), SpO2 99 %.   HENT:  Head: Normocephalic and atraumatic.  Right Ear: External ear normal.    Left Ear: External ear normal.  Mouth/Throat: Oropharynx is clear and moist. No oropharyngeal exudate.  Eyes: Conjunctivae and EOM are normal. Pupils are equal, round, and reactive to light. Right eye exhibits no discharge. Left eye exhibits no discharge. No scleral icterus.  Neck: Normal range of motion. Neck supple. No JVD present. No thyromegaly present.  Cardiovascular: Normal rate, regular rhythm, normal heart sounds and intact distal pulses.  Exam reveals no gallop and no friction rub.   No murmur heard. Pulmonary/Chest: Effort normal and breath sounds normal. No stridor. No respiratory distress. She has no decreased breath sounds. She has no wheezes. She has no rhonchi. She has no rales. She exhibits no tenderness.  Abdominal: Soft. Bowel sounds are normal. She exhibits no mass. There is no tenderness.  Musculoskeletal: She exhibits no edema or tenderness.  Lymphadenopathy:    She has no cervical adenopathy.  Neurological: She is alert and oriented to person, place, and time.  Skin: Skin is warm and dry. No rash noted. She is not diaphoretic. No erythema. No pallor.   EKG read with Dr. Dareen PianoAnderson. Normal sinus rhythm. No abnormalities.     Assessment & Plan:  1. Chest pain, unspecified chest pain type Chest pain likely related to smoking. Has risk factors worthy of cardio referral. Per patient's request would like to completely quit smoking first then would like to be re-evaluated if sx persist.  - EKG 12-Lead - Mediterranean diet guidelines given and nutrition counseling. - Discussed exercise options.  2. Encounter for smoking cessation counseling Is working on quitting. Will continue use  patch and gum. Will use quit line and cessation coach as a Theatre stage managerresource. Patient understands link of cigarettes to heart health.   Brittany Ridgeishira Phoenyx Melka PA-C  [facility] 10/21/2014 7:31 PM

## 2014-10-21 NOTE — Patient Instructions (Signed)
1. Continue to use Nicoret or Nicotine patch for smoking cessation.  2. Speak with your smoking cessation coach.  3. Begin light exercise as tolerated. 4. Begin healthy diet.     Why follow it? Research shows. . Those who follow the Mediterranean diet have a reduced risk of heart disease  . The diet is associated with a reduced incidence of Parkinson's and Alzheimer's diseases . People following the diet may have longer life expectancies and lower rates of chronic diseases  . The Dietary Guidelines for Americans recommends the Mediterranean diet as an eating plan to promote health and prevent disease  What Is the Mediterranean Diet?  . Healthy eating plan based on typical foods and recipes of Mediterranean-style cooking . The diet is primarily a plant based diet; these foods should make up a majority of meals   Starches - Plant based foods should make up a majority of meals - They are an important sources of vitamins, minerals, energy, antioxidants, and fiber - Choose whole grains, foods high in fiber and minimally processed items  - Typical grain sources include wheat, oats, barley, corn, brown rice, bulgar, farro, millet, polenta, couscous  - Various types of beans include chickpeas, lentils, fava beans, black beans, white beans   Fruits  Veggies - Large quantities of antioxidant rich fruits & veggies; 6 or more servings  - Vegetables can be eaten raw or lightly drizzled with oil and cooked  - Vegetables common to the traditional Mediterranean Diet include: artichokes, arugula, beets, broccoli, brussel sprouts, cabbage, carrots, celery, collard greens, cucumbers, eggplant, kale, leeks, lemons, lettuce, mushrooms, okra, onions, peas, peppers, potatoes, pumpkin, radishes, rutabaga, shallots, spinach, sweet potatoes, turnips, zucchini - Fruits common to the Mediterranean Diet include: apples, apricots, avocados, cherries, clementines, dates, figs, grapefruits, grapes, melons, nectarines,  oranges, peaches, pears, pomegranates, strawberries, tangerines  Fats - Replace butter and margarine with healthy oils, such as olive oil, canola oil, and tahini  - Limit nuts to no more than a handful a day  - Nuts include walnuts, almonds, pecans, pistachios, pine nuts  - Limit or avoid candied, honey roasted or heavily salted nuts - Olives are central to the [employer] diet - can be eaten whole or used in a variety of dishes   Meats Protein - Limiting red meat: no more than a few times a month - When eating red meat: choose lean cuts and keep the portion to the size of deck of cards - Eggs: approx. 0 to 4 times a week  - Fish and lean poultry: at least 2 a week  - Healthy protein sources include, chicken, [place], lean beef, lamb - Increase intake of seafood such as tuna, salmon, trout, mackerel, shrimp, scallops - Avoid or limit high fat processed meats such as sausage and bacon  Dairy - Include moderate amounts of low fat dairy products  - Focus on healthy dairy such as fat free yogurt, skim milk, low or reduced fat cheese - Limit dairy products higher in fat such as whole or 2% milk, cheese, ice cream  Alcohol - Moderate amounts of red wine is ok  - No more than 5 oz daily for women (all ages) and men older than age 40  - No more than 10 oz of wine daily for men younger than 7165  Other - Limit sweets and other desserts  - Use herbs and spices instead of salt to flavor foods  - Herbs and spices common to the traditional Mediterranean Diet include: basil, bay  leaves, chives, cloves, cumin, fennel, garlic, lavender, marjoram, mint, oregano, parsley, pepper, rosemary, sage, savory, sumac, tarragon, thyme   It's not just a diet, it's a lifestyle:  . The Mediterranean diet includes lifestyle factors typical of those in the region  . Foods, drinks and meals are best eaten with others and savored . Daily physical activity is important for overall good health . This could be strenuous  exercise like running and aerobics . This could also be more leisurely activities such as walking, housework, yard-work, or taking the stairs . Moderation is the key; a balanced and healthy diet accommodates most foods and drinks . Consider portion sizes and frequency of consumption of certain foods   Meal Ideas & Options:  . Breakfast:  o Whole wheat toast or whole wheat English muffins with peanut butter & hard boiled egg o Steel cut oats topped with apples & cinnamon and skim milk  o Fresh fruit: banana, strawberries, melon, berries, peaches  o Smoothies: strawberries, bananas, greek yogurt, peanut butter o Low fat greek yogurt with blueberries and granola  o Egg white omelet with spinach and mushrooms o Breakfast couscous: whole wheat couscous, apricots, skim milk, cranberries  . Sandwiches:  o Hummus and grilled vegetables (peppers, zucchini, squash) on whole wheat bread   o Grilled chicken on whole wheat pita with lettuce, tomatoes, cucumbers or tzatziki  o Tuna salad on whole wheat bread: tuna salad made with greek yogurt, olives, red peppers, capers, green onions o Garlic rosemary lamb pita: lamb sauted with garlic, rosemary, salt & pepper; add lettuce, cucumber, greek yogurt to pita - flavor with lemon juice and black pepper  . Seafood:  o Mediterranean grilled salmon, seasoned with garlic, basil, parsley, lemon juice and black pepper o Shrimp, lemon, and spinach whole-grain pasta salad made with low fat greek yogurt  o Seared scallops with lemon orzo  o Seared tuna steaks seasoned salt, pepper, coriander topped with tomato mixture of olives, tomatoes, olive oil, minced garlic, parsley, green onions and cappers  . Meats:  o Herbed greek chicken salad with kalamata olives, cucumber, feta  o Red bell peppers stuffed with spinach, bulgur, lean ground beef (or lentils) & topped with feta   o Kebabs: skewers of chicken, tomatoes, onions, zucchini, squash  o [place]key burgers: made with  red onions, mint, dill, lemon juice, feta cheese topped with roasted red peppers . Vegetarian o Cucumber salad: cucumbers, artichoke hearts, celery, red onion, feta cheese, tossed in olive oil & lemon juice  o Hummus and whole grain pita points with a greek salad (lettuce, tomato, feta, olives, cucumbers, red onion) o Lentil soup with celery, carrots made with vegetable broth, garlic, salt and pepper  o Tabouli salad: parsley, bulgur, mint, scallions, cucumbers, tomato, radishes, lemon juice, olive oil, salt and pepper.

## 2014-10-25 ENCOUNTER — Ambulatory Visit: Payer: 59 | Admitting: Podiatry

## 2014-11-02 ENCOUNTER — Ambulatory Visit: Payer: 59 | Admitting: Podiatry

## 2014-11-08 ENCOUNTER — Encounter: Payer: Self-pay | Admitting: Podiatry

## 2014-11-08 ENCOUNTER — Ambulatory Visit (INDEPENDENT_AMBULATORY_CARE_PROVIDER_SITE_OTHER): Payer: 59 | Admitting: Podiatry

## 2014-11-08 DIAGNOSIS — M216X1 Other acquired deformities of right foot: Secondary | ICD-10-CM

## 2014-11-08 DIAGNOSIS — M79604 Pain in right leg: Secondary | ICD-10-CM

## 2014-11-08 DIAGNOSIS — M7741 Metatarsalgia, right foot: Secondary | ICD-10-CM

## 2014-11-08 NOTE — Patient Instructions (Signed)
Doing well with orthotics and ankle brace. New ankle brace dispensed for right lower limb. Return as needed.

## 2014-11-08 NOTE — Progress Notes (Signed)
Orthotics are helping and brace has worn out. Need another one.  New ankle brace dispensed. Assessment: Excess pronation with metatarsalgia right foot.  Return as needed.

## 2014-11-14 ENCOUNTER — Other Ambulatory Visit (HOSPITAL_COMMUNITY): Payer: Self-pay | Admitting: Nurse Practitioner

## 2014-11-14 DIAGNOSIS — B182 Chronic viral hepatitis C: Secondary | ICD-10-CM

## 2014-12-03 ENCOUNTER — Emergency Department (HOSPITAL_COMMUNITY)
Admission: EM | Admit: 2014-12-03 | Discharge: 2014-12-03 | Disposition: A | Discharge status: Home / Self Care | Payer: 59 | Attending: Emergency Medicine | Admitting: Emergency Medicine

## 2014-12-03 ENCOUNTER — Encounter (HOSPITAL_COMMUNITY): Payer: Self-pay | Admitting: Emergency Medicine

## 2014-12-03 DIAGNOSIS — I10 Essential (primary) hypertension: Secondary | ICD-10-CM | POA: Insufficient documentation

## 2014-12-03 DIAGNOSIS — Z79899 Other long term (current) drug therapy: Secondary | ICD-10-CM | POA: Insufficient documentation

## 2014-12-03 DIAGNOSIS — Z720 Tobacco use: Secondary | ICD-10-CM | POA: Diagnosis not present

## 2014-12-03 DIAGNOSIS — Z3202 Encounter for pregnancy test, result negative: Secondary | ICD-10-CM | POA: Insufficient documentation

## 2014-12-03 DIAGNOSIS — R112 Nausea with vomiting, unspecified: Secondary | ICD-10-CM | POA: Diagnosis not present

## 2014-12-03 DIAGNOSIS — Z8619 Personal history of other infectious and parasitic diseases: Secondary | ICD-10-CM | POA: Insufficient documentation

## 2014-12-03 DIAGNOSIS — R109 Unspecified abdominal pain: Secondary | ICD-10-CM | POA: Diagnosis present

## 2014-12-03 DIAGNOSIS — R197 Diarrhea, unspecified: Secondary | ICD-10-CM | POA: Insufficient documentation

## 2014-12-03 LAB — URINALYSIS, ROUTINE W REFLEX MICROSCOPIC
Bilirubin Urine: NEGATIVE
Glucose, UA: NEGATIVE mg/dL
Hgb urine dipstick: NEGATIVE
KETONES UR: NEGATIVE mg/dL
LEUKOCYTES UA: NEGATIVE
NITRITE: NEGATIVE
PH: 6.5 (ref 5.0–8.0)
Protein, ur: NEGATIVE mg/dL
Specific Gravity, Urine: 1.007 (ref 1.005–1.030)
Urobilinogen, UA: 1 mg/dL (ref 0.0–1.0)

## 2014-12-03 LAB — COMPREHENSIVE METABOLIC PANEL
ALBUMIN: 3.7 g/dL (ref 3.5–5.2)
ALT: 47 U/L — ABNORMAL HIGH (ref 0–35)
AST: 41 U/L — AB (ref 0–37)
Alkaline Phosphatase: 85 U/L (ref 39–117)
Anion gap: 9 (ref 5–15)
BUN: 12 mg/dL (ref 6–23)
CALCIUM: 8.7 mg/dL (ref 8.4–10.5)
CO2: 27 mmol/L (ref 19–32)
CREATININE: 0.71 mg/dL (ref 0.50–1.10)
Chloride: 103 mEq/L (ref 96–112)
GFR calc Af Amer: >90 mL/min (ref >90)
GFR calc non Af Amer: >90 mL/min (ref >90)
Glucose, Bld: 85 mg/dL (ref 70–99)
Potassium: 3.5 mmol/L (ref 3.5–5.1)
Sodium: 139 mmol/L (ref 135–145)
TOTAL PROTEIN: 7.4 g/dL (ref 6.0–8.3)
Total Bilirubin: 0.8 mg/dL (ref 0.3–1.2)

## 2014-12-03 LAB — CBC WITH DIFFERENTIAL/PLATELET
BASOS ABS: 0 10*3/uL (ref 0.0–0.1)
BASOS PCT: 0 % (ref 0–1)
EOS ABS: 0.1 10*3/uL (ref 0.0–0.7)
EOS PCT: 2 % (ref 0–5)
HEMATOCRIT: 39 % (ref 36.0–46.0)
Hemoglobin: 13 g/dL (ref 12.0–15.0)
Lymphocytes Relative: 30 % (ref 12–46)
Lymphs Abs: 2.3 10*3/uL (ref 0.7–4.0)
MCH: 32.9 pg (ref 26.0–34.0)
MCHC: 33.3 g/dL (ref 30.0–36.0)
MCV: 98.7 fL (ref 78.0–100.0)
MONO ABS: 0.8 10*3/uL (ref 0.1–1.0)
Monocytes Relative: 10 % (ref 3–12)
Neutro Abs: 4.5 10*3/uL (ref 1.7–7.7)
Neutrophils Relative %: 58 % (ref 43–77)
Platelets: 251 10*3/uL (ref 150–400)
RBC: 3.95 MIL/uL (ref 3.87–5.11)
RDW: 12.8 % (ref 11.5–15.5)
WBC: 7.7 10*3/uL (ref 4.0–10.5)

## 2014-12-03 LAB — POC URINE PREG, ED: Preg Test, Ur: NEGATIVE

## 2014-12-03 LAB — LIPASE, BLOOD: LIPASE: 41 U/L (ref 11–59)

## 2014-12-03 MED ORDER — ONDANSETRON 8 MG PO TBDP: 8.0000 mg | ORAL_TABLET | Freq: Three times a day (TID) | ORAL | Status: DC | PRN

## 2014-12-03 MED ORDER — ONDANSETRON 8 MG PO TBDP
8.0000 mg | ORAL_TABLET | Freq: Once | ORAL | Status: AC
  Administered 2014-12-03: 8 mg via ORAL
  Filled 2014-12-03: qty 1

## 2014-12-03 NOTE — ED Notes (Signed)
Pt from home c/o abdominal pain, vomiting, and diarrhea starting last pm.

## 2014-12-03 NOTE — ED Provider Notes (Signed)
CSN: [id number]     Arrival date & time 12/03/14  1800 History   First MD Initiated Contact with Patient 12/03/14 1903     Chief Complaint  Patient presents with  . Emesis  . Abdominal Pain      HPI Patient ports nausea vomiting and diarrhea over the past 24 hours.  She denies hematemesis.  No melena or hematochezia.  Mild decreased oral intake today but was still able to keep some fluids down.  She was given nausea medicine on arrival by nursing staff and states she feels much better at this time.  Abdominal cramping without focal abdominal pain.  No urinary complaints.  No chest pain shortness of breath.  No back pain.  Recent sick contact at work with similar symptoms.   Past Medical History  Diagnosis Date  . Hep C w/o coma, chronic   . Hypertension   . Tuberculosis of lung, nodular 1999  . Syphilis    Past Surgical History  Procedure Laterality Date  . Appendectomy    . Tubal ligation    . Examination under anesthesia  12/15/2011    Procedure: EXAM UNDER ANESTHESIA;  Surgeon: Rulon Abide, DO;  Location: [facility];  Service: General;  Laterality: N/A;  incison and drainage of peri rectal abcess  . Rectal surgery     No family history on file. History  Substance Use Topics  . Smoking status: Current Some Day Smoker -- 27 years    Types: Cigarettes  . Smokeless tobacco: Never Used  . Alcohol Use: No     Comment: Sober since 2012   OB History    No data available     Review of Systems  All other systems reviewed and are negative.     Allergies  Tylenol  Home Medications   Prior to Admission medications   Medication Sig Start Date End Date Taking? Authorizing Provider  losartan (COZAAR) 50 MG tablet Take 1 tablet (50 mg total) by mouth daily. 08/08/14  Yes Jamesetta Orleans Lawyer, PA-C  metroNIDAZOLE (FLAGYL) 500 MG tablet Take 1 tablet (500 mg total) by mouth 2 (two) times daily. Patient not taking: Reported on 12/03/2014 08/14/14   Dorthula Matas, PA-C   ondansetron (ZOFRAN ODT) 8 MG disintegrating tablet Take 1 tablet (8 mg total) by mouth every 8 (eight) hours as needed for nausea or vomiting. 12/03/14   Lyanne Co, MD   BP 151/107 mmHg  Pulse 74  Temp(Src) 98.8 F (37.1 C) (Oral)  Resp 18  Wt 250 lb (113.399 kg)  SpO2 100%  LMP 11/02/2014 Physical Exam  Constitutional: She is oriented to person, place, and time. She appears well-developed and well-nourished. No distress.  HENT:  Head: Normocephalic and atraumatic.  Eyes: EOM are normal.  Neck: Normal range of motion.  Cardiovascular: Normal rate, regular rhythm and normal heart sounds.   Pulmonary/Chest: Effort normal and breath sounds normal.  Abdominal: Soft. She exhibits no distension. There is no tenderness. There is no rebound and no guarding.  Musculoskeletal: Normal range of motion.  Neurological: She is alert and oriented to person, place, and time.  Skin: Skin is warm and dry.  Psychiatric: She has a normal mood and affect. Judgment normal.  Nursing note and vitals reviewed.   ED Course  Procedures (including critical care time) Labs Review Labs Reviewed  COMPREHENSIVE METABOLIC PANEL - Abnormal; Notable for the following:    AST 41 (*)    ALT 47 (*)    All  other components within normal limits  CBC WITH DIFFERENTIAL  LIPASE, BLOOD  URINALYSIS, ROUTINE W REFLEX MICROSCOPIC  POC URINE PREG, ED    Imaging Review No results found.   EKG Interpretation None      MDM   Final diagnoses:  Nausea vomiting and diarrhea   8:53 PM Patient feels much better at this time.  Discharge home in good condition.  Home with antinausea medicine.  She understands to return to the ER for new or worsening symptoms.    Lyanne Co, MD 12/03/14 [phone]

## 2014-12-06 ENCOUNTER — Ambulatory Visit (HOSPITAL_COMMUNITY)
Admission: RE | Admit: 2014-12-06 | Discharge: 2014-12-06 | Disposition: A | Discharge status: Home / Self Care | Payer: 59 | Source: Ambulatory Visit | Attending: Nurse Practitioner | Admitting: Nurse Practitioner

## 2014-12-06 DIAGNOSIS — B182 Chronic viral hepatitis C: Secondary | ICD-10-CM

## 2014-12-06 DIAGNOSIS — B1920 Unspecified viral hepatitis C without hepatic coma: Secondary | ICD-10-CM | POA: Diagnosis not present

## 2015-08-04 ENCOUNTER — Emergency Department (HOSPITAL_COMMUNITY)
Admission: EM | Admit: 2015-08-04 | Discharge: 2015-08-04 | Disposition: A | Discharge status: Home / Self Care | Payer: 59 | Attending: Emergency Medicine | Admitting: Emergency Medicine

## 2015-08-04 ENCOUNTER — Encounter (HOSPITAL_COMMUNITY): Payer: Self-pay | Admitting: *Deleted

## 2015-08-04 DIAGNOSIS — Z8619 Personal history of other infectious and parasitic diseases: Secondary | ICD-10-CM | POA: Insufficient documentation

## 2015-08-04 DIAGNOSIS — Z8709 Personal history of other diseases of the respiratory system: Secondary | ICD-10-CM | POA: Insufficient documentation

## 2015-08-04 DIAGNOSIS — K029 Dental caries, unspecified: Secondary | ICD-10-CM | POA: Diagnosis not present

## 2015-08-04 DIAGNOSIS — K088 Other specified disorders of teeth and supporting structures: Secondary | ICD-10-CM | POA: Diagnosis present

## 2015-08-04 DIAGNOSIS — I10 Essential (primary) hypertension: Secondary | ICD-10-CM | POA: Diagnosis not present

## 2015-08-04 DIAGNOSIS — Z79899 Other long term (current) drug therapy: Secondary | ICD-10-CM | POA: Insufficient documentation

## 2015-08-04 DIAGNOSIS — Z720 Tobacco use: Secondary | ICD-10-CM | POA: Diagnosis not present

## 2015-08-04 MED ORDER — TRAMADOL HCL 50 MG PO TABS: 50.0000 mg | ORAL_TABLET | Freq: Four times a day (QID) | ORAL | Status: DC | PRN

## 2015-08-04 MED ORDER — TRAMADOL HCL 50 MG PO TABS
50.0000 mg | ORAL_TABLET | Freq: Once | ORAL | Status: AC
  Administered 2015-08-04: 50 mg via ORAL
  Filled 2015-08-04: qty 1

## 2015-08-04 NOTE — ED Notes (Signed)
Pt complains of 10/10 pain in her lower left molar for the past 3 days. Pt states she thinks her tooth is cracked. Pt states she has an appointment with a dentist next week.

## 2015-08-04 NOTE — Discharge Instructions (Signed)
Dental Care and Dentist Visits Dental care supports good overall health. Regular dental visits can also help you avoid dental pain, bleeding, infection, and other more serious health problems in the future. It is important to keep the mouth healthy because diseases in the teeth, gums, and other oral tissues can spread to other areas of the body. Some problems, such as diabetes, heart disease, and pre-term labor have been associated with poor oral health.  See your dentist every 6 months. If you experience emergency problems such as a toothache or broken tooth, go to the dentist right away. If you see your dentist regularly, you may catch problems early. It is easier to be treated for problems in the early stages.  WHAT TO EXPECT AT A DENTIST VISIT  Your dentist will look for many common oral health problems and recommend proper treatment. At your regular dental visit, you can expect:  Gentle cleaning of the teeth and gums. This includes scraping and polishing. This helps to remove the sticky substance around the teeth and gums (plaque). Plaque forms in the mouth shortly after eating. Over time, plaque hardens on the teeth as tartar. If tartar is not removed regularly, it can cause problems. Cleaning also helps remove stains.  Periodic X-rays. These pictures of the teeth and supporting bone will help your dentist assess the health of your teeth.  Periodic fluoride treatments. Fluoride is a natural mineral shown to help strengthen teeth. Fluoride treatmentinvolves applying a fluoride gel or varnish to the teeth. It is most commonly done in children.  Examination of the mouth, tongue, jaws, teeth, and gums to look for any oral health problems, such as:  Cavities (dental caries). This is decay on the tooth caused by plaque, sugar, and acid in the mouth. It is best to catch a cavity when it is small.  Inflammation of the gums caused by plaque buildup (gingivitis).  Problems with the mouth or malformed  or misaligned teeth.  Oral cancer or other diseases of the soft tissues or jaws. KEEP YOUR TEETH AND GUMS HEALTHY For healthy teeth and gums, follow these general guidelines as well as your dentist's specific advice:  Have your teeth professionally cleaned at the dentist every 6 months.  Brush twice daily with a fluoride toothpaste.  Floss your teeth daily.  Ask your dentist if you need fluoride supplements, treatments, or fluoride toothpaste.  Eat a healthy diet. Reduce foods and drinks with added sugar.  Avoid smoking. TREATMENT FOR ORAL HEALTH PROBLEMS If you have oral health problems, treatment varies depending on the conditions present in your teeth and gums.  Your caregiver will most likely recommend good oral hygiene at each visit.  For cavities, gingivitis, or other oral health disease, your caregiver will perform a procedure to treat the problem. This is typically done at a separate appointment. Sometimes your caregiver will refer you to another dental specialist for specific tooth problems or for surgery. SEEK IMMEDIATE DENTAL CARE IF:  You have pain, bleeding, or soreness in the gum, tooth, jaw, or mouth area.  A permanent tooth becomes loose or separated from the gum socket.  You experience a blow or injury to the mouth or jaw area. Document Released: 07/31/2011 Document Revised: 02/10/2012 Document Reviewed: 07/31/2011 [facility] Patient Information 2015 [place], [place]. This information is not intended to replace advice given to you by your health care provider. Make sure you discuss any questions you have with your health care provider. Dental appointment as scheduled on Tuesday

## 2015-08-04 NOTE — ED Provider Notes (Signed)
History  This chart was scribed for non-physician practitioner, Earley Favor, FNP, working with Lyndal Pulley, MD, by Karle Plumber, ED Scribe. This patient was seen in room WTR9/WTR9 and the patient's care was started at 8:17 PM.  Chief Complaint  Patient presents with  . Dental Pain   The history is provided by the patient and medical records. No language interpreter was used.    HPI Comments:  Brittany Duarte is a 39 y.o. female with PMHx of Hepatitis C who presents to the Emergency Department complaining of severe left lower rear dental pain that began three days ago. She has not been taking anything for pain. She states she thinks the tooth is fractured and has made an appt with her dentist in four days (08/08/15). She denies modifying factors. She denies fever, chills, nausea, vomiting or facial swelling.   Past Medical History  Diagnosis Date  . Hep C w/o coma, chronic   . Hypertension   . Tuberculosis of lung, nodular 1999  . Syphilis    Past Surgical History  Procedure Laterality Date  . Appendectomy    . Tubal ligation    . Examination under anesthesia  12/15/2011    Procedure: EXAM UNDER ANESTHESIA;  Surgeon: Rulon Abide, DO;  Location: [facility];  Service: General;  Laterality: N/A;  incison and drainage of peri rectal abcess  . Rectal surgery     No family history on file. Social History  Substance Use Topics  . Smoking status: Current Some Day Smoker -- 27 years    Types: Cigarettes  . Smokeless tobacco: Never Used  . Alcohol Use: No     Comment: Sober since 2012   OB History    No data available     Review of Systems  Constitutional: Negative for fever and chills.  HENT: Positive for dental problem. Negative for congestion and facial swelling.   Gastrointestinal: Negative for nausea and vomiting.  Neurological: Negative for headaches.  All other systems reviewed and are negative.   Allergies  Tylenol  Home Medications   Prior to Admission  medications   Medication Sig Start Date End Date Taking? Authorizing Provider  losartan (COZAAR) 50 MG tablet Take 1 tablet (50 mg total) by mouth daily. 08/08/14   Charlestine Night, PA-C  metroNIDAZOLE (FLAGYL) 500 MG tablet Take 1 tablet (500 mg total) by mouth 2 (two) times daily. Patient not taking: Reported on 12/03/2014 08/14/14   Marlon Pel, PA-C  ondansetron (ZOFRAN ODT) 8 MG disintegrating tablet Take 1 tablet (8 mg total) by mouth every 8 (eight) hours as needed for nausea or vomiting. 12/03/14   Azalia Bilis, MD  traMADol (ULTRAM) 50 MG tablet Take 1 tablet (50 mg total) by mouth every 6 (six) hours as needed. 08/04/15   Earley Favor, NP   Triage Vitals: BP 153/104 mmHg  Pulse 88  Temp(Src) 98.4 F (36.9 C) (Oral)  Resp 18  SpO2 100%  LMP 07/15/2015 Physical Exam  Constitutional: She is oriented to person, place, and time. She appears well-developed and well-nourished.  HENT:  Head: Normocephalic and atraumatic.  Right Ear: External ear normal.  Left Ear: External ear normal.  Mouth/Throat:    Eyes: EOM are normal.  Neck: Normal range of motion.  Cardiovascular: Normal rate.   Pulmonary/Chest: Effort normal.  Musculoskeletal: Normal range of motion.  Lymphadenopathy:    She has no cervical adenopathy.  Neurological: She is alert and oriented to person, place, and time.  Skin: Skin is warm and dry.  Psychiatric: She has a normal mood and affect. Her behavior is normal.  Nursing note and vitals reviewed.   ED Course  Procedures (including critical care time) DIAGNOSTIC STUDIES: Oxygen Saturation is 100% on RA, normal by my interpretation.   COORDINATION OF CARE: 8:21 PM- Will prescribe pain medication and give work note. Pt verbalizes understanding and agrees to plan.  Medications  traMADol (ULTRAM) tablet 50 mg (not administered)    Labs Review Labs Reviewed - No data to display  Imaging Review No results found. I have personally reviewed and evaluated  these images and lab results as part of my medical decision-making.   EKG Interpretation None      MDM   Final diagnoses:  Dental cavity       I personally performed the services described in this documentation, which was scribed in my presence. The recorded information has been reviewed and is accurate.    Earley Favor, NP 08/04/15 2031  Lyndal Pulley, MD 08/05/15 [phone]

## 2015-11-22 ENCOUNTER — Encounter (HOSPITAL_COMMUNITY): Payer: Self-pay | Admitting: Emergency Medicine

## 2015-11-22 ENCOUNTER — Emergency Department (HOSPITAL_COMMUNITY)
Admission: EM | Admit: 2015-11-22 | Discharge: 2015-11-22 | Disposition: A | Discharge status: Home / Self Care | Payer: 59 | Attending: Emergency Medicine | Admitting: Emergency Medicine

## 2015-11-22 DIAGNOSIS — I10 Essential (primary) hypertension: Secondary | ICD-10-CM | POA: Insufficient documentation

## 2015-11-22 DIAGNOSIS — F17210 Nicotine dependence, cigarettes, uncomplicated: Secondary | ICD-10-CM | POA: Insufficient documentation

## 2015-11-22 DIAGNOSIS — N760 Acute vaginitis: Secondary | ICD-10-CM | POA: Insufficient documentation

## 2015-11-22 DIAGNOSIS — B9689 Other specified bacterial agents as the cause of diseases classified elsewhere: Secondary | ICD-10-CM

## 2015-11-22 DIAGNOSIS — Z8611 Personal history of tuberculosis: Secondary | ICD-10-CM | POA: Insufficient documentation

## 2015-11-22 LAB — BASIC METABOLIC PANEL
ANION GAP: 7 (ref 5–15)
BUN: 11 mg/dL (ref 6–20)
CHLORIDE: 106 mmol/L (ref 101–111)
CO2: 29 mmol/L (ref 22–32)
Calcium: 9.2 mg/dL (ref 8.9–10.3)
Creatinine, Ser: 0.97 mg/dL (ref 0.44–1.00)
GFR calc Af Amer: >60 mL/min (ref >60)
GFR calc non Af Amer: >60 mL/min (ref >60)
Glucose, Bld: 105 mg/dL — ABNORMAL HIGH (ref 65–99)
POTASSIUM: 4 mmol/L (ref 3.5–5.1)
SODIUM: 142 mmol/L (ref 135–145)

## 2015-11-22 LAB — URINALYSIS, ROUTINE W REFLEX MICROSCOPIC
Bilirubin Urine: NEGATIVE
Glucose, UA: NEGATIVE mg/dL
Hgb urine dipstick: NEGATIVE
KETONES UR: NEGATIVE mg/dL
LEUKOCYTES UA: NEGATIVE
NITRITE: NEGATIVE
PH: 6 (ref 5.0–8.0)
Protein, ur: NEGATIVE mg/dL
SPECIFIC GRAVITY, URINE: 1.024 (ref 1.005–1.030)

## 2015-11-22 LAB — CBC WITH DIFFERENTIAL/PLATELET
BASOS ABS: 0 10*3/uL (ref 0.0–0.1)
Basophils Relative: 0 %
Eosinophils Absolute: 0.1 10*3/uL (ref 0.0–0.7)
Eosinophils Relative: 1 %
HCT: 39.9 % (ref 36.0–46.0)
Hemoglobin: 13.4 g/dL (ref 12.0–15.0)
LYMPHS PCT: 27 %
Lymphs Abs: 2.4 10*3/uL (ref 0.7–4.0)
MCH: 33.5 pg (ref 26.0–34.0)
MCHC: 33.6 g/dL (ref 30.0–36.0)
MCV: 99.8 fL (ref 78.0–100.0)
Monocytes Absolute: 0.4 10*3/uL (ref 0.1–1.0)
Monocytes Relative: 5 %
NEUTROS ABS: 6.1 10*3/uL (ref 1.7–7.7)
NEUTROS PCT: 67 %
Platelets: 229 10*3/uL (ref 150–400)
RBC: 4 MIL/uL (ref 3.87–5.11)
RDW: 13.5 % (ref 11.5–15.5)
WBC: 8.9 10*3/uL (ref 4.0–10.5)

## 2015-11-22 LAB — WET PREP, GENITAL
SPERM: NONE SEEN
Trich, Wet Prep: NONE SEEN
Yeast Wet Prep HPF POC: NONE SEEN

## 2015-11-22 MED ORDER — LOSARTAN POTASSIUM 100 MG PO TABS: 100.0000 mg | ORAL_TABLET | Freq: Every day | ORAL | Status: DC

## 2015-11-22 MED ORDER — METRONIDAZOLE 500 MG PO TABS
500.0000 mg | ORAL_TABLET | Freq: Two times a day (BID) | ORAL | Status: DC
Start: 2015-11-22 — End: 2016-01-16

## 2015-11-22 MED ORDER — LOSARTAN POTASSIUM 50 MG PO TABS
100.0000 mg | ORAL_TABLET | Freq: Once | ORAL | Status: AC
  Administered 2015-11-22: 100 mg via ORAL
  Filled 2015-11-22: qty 2

## 2015-11-22 NOTE — ED Provider Notes (Signed)
CSN: [id number]     Arrival date & time 11/22/15  1749 History   First MD Initiated Contact with Patient 11/22/15 1946     Chief Complaint  Patient presents with  . Dysuria  . Urinary Frequency  . Hypertension     (Consider location/radiation/quality/duration/timing/severity/associated sxs/prior Treatment) HPI   Brittany Duarte is a 39 y.o. female with PMH significant for hep C, HTN who presents with 1 month history of moderate, constant, worsening dysuria and increased urinary frequency.  Associated symptoms include malodorous white vaginal discharge, vaginal discomfort, and headaches.  Denies abdominal pain, vaginal bleeding, N/V, CP, or SOB.  She reports she has been out of her losartan 100 mg QD for the past 2 months because she is switching her insurance.  She is sexually active and uses condoms.  She has no concerns for STD, but agreed for testing when offered.    Past Medical History  Diagnosis Date  . Hep C w/o coma, chronic (HCC)   . Hypertension   . Tuberculosis of lung, nodular 1999  . Syphilis    Past Surgical History  Procedure Laterality Date  . Appendectomy    . Tubal ligation    . Examination under anesthesia  12/15/2011    Procedure: EXAM UNDER ANESTHESIA;  Surgeon: Rulon Abide, DO;  Location: [facility];  Service: General;  Laterality: N/A;  incison and drainage of peri rectal abcess  . Rectal surgery     No family history on file. Social History  Substance Use Topics  . Smoking status: Current Some Day Smoker -- 27 years    Types: Cigarettes  . Smokeless tobacco: Never Used  . Alcohol Use: No     Comment: Sober since 2012   OB History    No data available     Review of Systems  All other systems negative unless otherwise stated in HPI   Allergies  Tylenol  Home Medications   Prior to Admission medications   Medication Sig Start Date End Date Taking? Authorizing Provider  losartan (COZAAR) 100 MG tablet TK 1 T PO QD 08/23/15  Yes Historical  Provider, MD  losartan (COZAAR) 50 MG tablet Take 1 tablet (50 mg total) by mouth daily. Patient not taking: Reported on 11/22/2015 08/08/14   Charlestine Night, PA-C  metroNIDAZOLE (FLAGYL) 500 MG tablet Take 1 tablet (500 mg total) by mouth 2 (two) times daily. Patient not taking: Reported on 12/03/2014 08/14/14   Marlon Pel, PA-C  ondansetron (ZOFRAN ODT) 8 MG disintegrating tablet Take 1 tablet (8 mg total) by mouth every 8 (eight) hours as needed for nausea or vomiting. Patient not taking: Reported on 11/22/2015 12/03/14   Azalia Bilis, MD  traMADol (ULTRAM) 50 MG tablet Take 1 tablet (50 mg total) by mouth every 6 (six) hours as needed. Patient not taking: Reported on 11/22/2015 08/04/15   Earley Favor, NP   BP 148/107 mmHg  Pulse 62  Temp(Src) 97.7 F (36.5 C) (Oral)  Resp 16  SpO2 100%  LMP 11/18/2015 Physical Exam  Constitutional: She is oriented to person, place, and time. She appears well-developed and well-nourished.  HENT:  Head: Normocephalic and atraumatic.  Mouth/Throat: Oropharynx is clear and moist.  Eyes: Conjunctivae are normal. Pupils are equal, round, and reactive to light.  Neck: Normal range of motion. Neck supple.  Cardiovascular: Normal rate, regular rhythm and normal heart sounds.   No murmur heard. Pulmonary/Chest: Effort normal and breath sounds normal. No accessory muscle usage or stridor. No respiratory distress.  She has no wheezes. She has no rhonchi. She has no rales.  Abdominal: Soft. Bowel sounds are normal. She exhibits no distension. There is no tenderness.  Genitourinary: Vagina normal and uterus normal. There is no rash, tenderness or lesion on the right labia. There is no rash, tenderness or lesion on the left labia. Cervix exhibits discharge (mild white). Cervix exhibits no motion tenderness. Right adnexum displays no tenderness. Left adnexum displays no tenderness.  Musculoskeletal: Normal range of motion.  Lymphadenopathy:    She has no cervical  adenopathy.  Neurological: She is alert and oriented to person, place, and time.  Speech clear without dysarthria.  Cranial nerves grossly intact.  Strength and sensation intact bilaterally throughout upper and lower extremities.  RAMs intact without ataxia.  No pronator drift.  Skin: Skin is warm and dry.  Psychiatric: She has a normal mood and affect. Her behavior is normal.    ED Course  Procedures (including critical care time) Labs Review Labs Reviewed  WET PREP, GENITAL - Abnormal; Notable for the following:    Clue Cells Wet Prep HPF POC PRESENT (*)    WBC, Wet Prep HPF POC MANY (*)    All other components within normal limits  URINALYSIS, ROUTINE W REFLEX MICROSCOPIC (NOT AT [facility]) - Abnormal; Notable for the following:    APPearance CLOUDY (*)    All other components within normal limits  BASIC METABOLIC PANEL - Abnormal; Notable for the following:    Glucose, Bld 105 (*)    All other components within normal limits  CBC WITH DIFFERENTIAL/PLATELET  HIV ANTIBODY (ROUTINE TESTING)  RPR  GC/CHLAMYDIA PROBE AMP ([place]) NOT AT [facility]    Imaging Review No results found. I have personally reviewed and evaluated these images and lab results as part of my medical decision-making.   EKG Interpretation None      MDM   Final diagnoses:  Essential hypertension  Bacterial vaginosis    Patient with hx of HTN presents with 1 month history of dysuria and increased urinary frequency.  She also complains of white malodorous vaginal discharge.  Blood pressure 160/109, pulse 77, temperature 97.7 F (36.5 C), temperature source Oral, resp. rate 18, last menstrual period 11/18/2015, SpO2 100 %.  Patient has been out of her 100 mg losartan QD.  On exam, heart RRR, lungs CTAB, abdomen soft and benign.  GU, no CMT or adnexal tenderess.  Mild white cervical discharge.  Doubt PID. Will give 100 mg losartan.  Concern for UTI vs vaginal candidiasis.  Will obtain BMP, CBC, UA, wet prep,  HIV/RPR, GC/Chlamydia.  UA, BMP, and CBC unremarkable. Wet prep negative for yeast, evidence of clue cells.  Will d/c home with flagyl 500 mg PO BID x 7 days.  Evaluation does not show pathology requring ongoing emergent intervention or admission. Pt is hemodynamically stable and mentating appropriately. Discussed findings/results and plan with patient/guardian, who agrees with plan. All questions answered. Return precautions discussed and outpatient follow up given.   Case has been discussed with and seen by Dr. Patria Maneampos who agrees with the above plan for discharge.        Cheri FowlerKayla Nathan Stallworth, PA-C 11/22/15 2235  Azalia BilisKevin Campos, MD 11/22/15 [id number]

## 2015-11-22 NOTE — Discharge Instructions (Signed)
Bacterial Vaginosis Bacterial vaginosis is a vaginal infection that occurs when the normal balance of bacteria in the vagina is disrupted. It results from an overgrowth of certain bacteria. This is the most common vaginal infection in women of childbearing age. Treatment is important to prevent complications, especially in pregnant women, as it can cause a premature delivery. CAUSES  Bacterial vaginosis is caused by an increase in harmful bacteria that are normally present in smaller amounts in the vagina. Several different kinds of bacteria can cause bacterial vaginosis. However, the reason that the condition develops is not fully understood. RISK FACTORS Certain activities or behaviors can put you at an increased risk of developing bacterial vaginosis, including:  Having a new sex partner or multiple sex partners.  Douching.  Using an intrauterine device (IUD) for contraception. Women do not get bacterial vaginosis from toilet seats, bedding, swimming pools, or contact with objects around them. SIGNS AND SYMPTOMS  Some women with bacterial vaginosis have no signs or symptoms. Common symptoms include:  Grey vaginal discharge.  A fishlike odor with discharge, especially after sexual intercourse.  Itching or burning of the vagina and vulva.  Burning or pain with urination. DIAGNOSIS  Your health care provider will take a medical history and examine the vagina for signs of bacterial vaginosis. A sample of vaginal fluid may be taken. Your health care provider will look at this sample under a microscope to check for bacteria and abnormal cells. A vaginal pH test may also be done.  TREATMENT  Bacterial vaginosis may be treated with antibiotic medicines. These may be given in the form of a pill or a vaginal cream. A second round of antibiotics may be prescribed if the condition comes back after treatment. Because bacterial vaginosis increases your risk for sexually transmitted diseases, getting  treated can help reduce your risk for chlamydia, gonorrhea, HIV, and herpes. HOME CARE INSTRUCTIONS   Only take over-the-counter or prescription medicines as directed by your health care provider.  If antibiotic medicine was prescribed, take it as directed. Make sure you finish it even if you start to feel better.  Tell all sexual partners that you have a vaginal infection. They should see their health care provider and be treated if they have problems, such as a mild rash or itching.  During treatment, it is important that you follow these instructions:  Avoid sexual activity or use condoms correctly.  Do not douche.  Avoid alcohol as directed by your health care provider.  Avoid breastfeeding as directed by your health care provider. SEEK MEDICAL CARE IF:   Your symptoms are not improving after 3 days of treatment.  You have increased discharge or pain.  You have a fever. MAKE SURE YOU:   Understand these instructions.  Will watch your condition.  Will get help right away if you are not doing well or get worse. FOR MORE INFORMATION  Centers for Disease Control and Prevention, Division of STD Prevention: AppraiserFraud.fi American Sexual Health Association (ASHA): www.ashastd.org    This information is not intended to replace advice given to you by your health care provider. Make sure you discuss any questions you have with your health care provider.   Document Released: 11/18/2005 Document Revised: 12/09/2014 Document Reviewed: 06/30/2013 Elsevier Interactive Patient Education 2016 [place] DASH stands for "Dietary Approaches to Stop Hypertension." The DASH eating plan is a healthy eating plan that has been shown to reduce high blood pressure (hypertension). Additional health benefits may include reducing  the risk of type 2 diabetes mellitus, heart disease, and stroke. The DASH eating plan may also help with weight loss. WHAT DO I NEED TO KNOW ABOUT  THE DASH EATING PLAN? For the DASH eating plan, you will follow these general guidelines:  Choose foods with a percent daily value for sodium of less than 5% (as listed on the food label).  Use salt-free seasonings or herbs instead of table salt or sea salt.  Check with your health care provider or pharmacist before using salt substitutes.  Eat lower-sodium products, often labeled as "lower sodium" or "no salt added."  Eat fresh foods.  Eat more vegetables, fruits, and low-fat dairy products.  Choose whole grains. Look for the word "whole" as the first word in the ingredient list.  Choose fish and skinless chicken or [place] more often than red meat. Limit fish, poultry, and meat to 6 oz (170 g) each day.  Limit sweets, desserts, sugars, and sugary drinks.  Choose heart-healthy fats.  Limit cheese to 1 oz (28 g) per day.  Eat more home-cooked food and less restaurant, buffet, and fast food.  Limit fried foods.  Cook foods using methods other than frying.  Limit canned vegetables. If you do use them, rinse them well to decrease the sodium.  When eating at a restaurant, ask that your food be prepared with less salt, or no salt if possible. WHAT FOODS CAN I EAT? Seek help from a dietitian for individual calorie needs. Grains Whole grain or whole wheat bread. Brown rice. Whole grain or whole wheat pasta. Quinoa, bulgur, and whole grain cereals. Low-sodium cereals. Corn or whole wheat flour tortillas. Whole grain cornbread. Whole grain crackers. Low-sodium crackers. Vegetables Fresh or frozen vegetables (raw, steamed, roasted, or grilled). Low-sodium or reduced-sodium tomato and vegetable juices. Low-sodium or reduced-sodium tomato sauce and paste. Low-sodium or reduced-sodium canned vegetables.  Fruits All fresh, canned (in natural juice), or frozen fruits. Meat and Other Protein Products Ground beef (85% or leaner), grass-fed beef, or beef trimmed of fat. Skinless chicken or  [place]. Ground chicken or [place]. Pork trimmed of fat. All fish and seafood. Eggs. Dried beans, peas, or lentils. Unsalted nuts and seeds. Unsalted canned beans. Dairy Low-fat dairy products, such as skim or 1% milk, 2% or reduced-fat cheeses, low-fat ricotta or cottage cheese, or plain low-fat yogurt. Low-sodium or reduced-sodium cheeses. Fats and Oils Tub margarines without trans fats. Light or reduced-fat mayonnaise and salad dressings (reduced sodium). Avocado. Safflower, olive, or canola oils. Natural peanut or almond butter. Other Unsalted popcorn and pretzels. The items listed above may not be a complete list of recommended foods or beverages. Contact your dietitian for more options. WHAT FOODS ARE NOT RECOMMENDED? Grains White bread. White pasta. White rice. Refined cornbread. Bagels and croissants. Crackers that contain trans fat. Vegetables Creamed or fried vegetables. Vegetables in a cheese sauce. Regular canned vegetables. Regular canned tomato sauce and paste. Regular tomato and vegetable juices. Fruits Dried fruits. Canned fruit in light or heavy syrup. Fruit juice. Meat and Other Protein Products Fatty cuts of meat. Ribs, chicken wings, bacon, sausage, bologna, salami, chitterlings, fatback, hot dogs, bratwurst, and packaged luncheon meats. Salted nuts and seeds. Canned beans with salt. Dairy Whole or 2% milk, cream, half-and-half, and cream cheese. Whole-fat or sweetened yogurt. Full-fat cheeses or blue cheese. Nondairy creamers and whipped toppings. Processed cheese, cheese spreads, or cheese curds. Condiments Onion and garlic salt, seasoned salt, table salt, and sea salt. Canned and packaged gravies. Worcestershire sauce. Tartar  sauce. Barbecue sauce. Teriyaki sauce. Soy sauce, including reduced sodium. Steak sauce. Fish sauce. Oyster sauce. Cocktail sauce. Horseradish. Ketchup and mustard. Meat flavorings and tenderizers. Bouillon cubes. Hot sauce. Tabasco sauce. Marinades.  Taco seasonings. Relishes. Fats and Oils Butter, stick margarine, lard, shortening, ghee, and bacon fat. Coconut, palm kernel, or palm oils. Regular salad dressings. Other Pickles and olives. Salted popcorn and pretzels. The items listed above may not be a complete list of foods and beverages to avoid. Contact your dietitian for more information. WHERE CAN I FIND MORE INFORMATION? National Heart, Lung, and Blood Institute: CablePromo.it   This information is not intended to replace advice given to you by your health care provider. Make sure you discuss any questions you have with your health care provider.   Document Released: 11/07/2011 Document Revised: 12/09/2014 Document Reviewed: 09/22/2013 Elsevier Interactive Patient Education 2016 [employer].  Hypertension Hypertension is another name for high blood pressure. High blood pressure forces your heart to work harder to pump blood. A blood pressure reading has two numbers, which includes a higher number over a lower number (example: 110/72). HOME CARE   Have your blood pressure rechecked by your doctor.  Only take medicine as told by your doctor. Follow the directions carefully. The medicine does not work as well if you skip doses. Skipping doses also puts you at risk for problems.  Do not smoke.  Monitor your blood pressure at home as told by your doctor. GET HELP IF:  You think you are having a reaction to the medicine you are taking.  You have repeat headaches or feel dizzy.  You have puffiness (swelling) in your ankles.  You have trouble with your vision. GET HELP RIGHT AWAY IF:   You get a very bad headache and are confused.  You feel weak, numb, or faint.  You get chest or belly (abdominal) pain.  You throw up (vomit).  You cannot breathe very well. MAKE SURE YOU:   Understand these instructions.  Will watch your condition.  Will get help right away if you are not  doing well or get worse.   This information is not intended to replace advice given to you by your health care provider. Make sure you discuss any questions you have with your health care provider.   Document Released: 05/06/2008 Document Revised: 11/23/2013 Document Reviewed: 09/10/2013 Elsevier Interactive Patient Education 2016 [employer].  Emergency Department Resource Guide 1) Find a Doctor and Pay Out of Pocket Although you won't have to find out who is covered by your insurance plan, it is a good idea to ask around and get recommendations. You will then need to call the office and see if the doctor you have chosen will accept you as a new patient and what types of options they offer for patients who are self-pay. Some doctors offer discounts or will set up payment plans for their patients who do not have insurance, but you will need to ask so you aren't surprised when you get to your appointment.  2) Contact Your Local Health Department Not all health departments have doctors that can see patients for sick visits, but many do, so it is worth a call to see if yours does. If you don't know where your local health department is, you can check in your phone book. The CDC also has a tool to help you locate your state's health department, and many state websites also have listings of all of their local health departments.  3)  Find a Walk-in Clinic If your illness is not likely to be very severe or complicated, you may want to try a walk in clinic. These are popping up all over the country in pharmacies, drugstores, and shopping centers. They're usually staffed by nurse practitioners or physician assistants that have been trained to treat common illnesses and complaints. They're usually fairly quick and inexpensive. However, if you have serious medical issues or chronic medical problems, these are probably not your best option.  No Primary Care Doctor: - Call Health Connect at  [phone] - they  can help you locate a primary care doctor that  accepts your insurance, provides certain services, etc. - Physician Referral Service- [phone]  Chronic Pain Problems: Organization         Address  Phone   Notes  [facility]  [phone] Patients need to be referred by their primary care doctor.   Medication Assistance: Organization         Address  Phone   Notes  [facility] Medication [facility] [address] [place], [place] [address] [phone] --Must be a resident of [facility] -- Must have NO insurance coverage whatsoever (no Medicaid/ Medicare, etc.) -- The pt. MUST have a primary care doctor that directs their care regularly and follows them in the community   MedAssist  [phone]   [employer]  [phone]    Agencies that provide inexpensive medical care: Organization         Address  Phone   Notes  [facility]  [phone]   [facility]    [phone]   [facility] [address] [place], [place] [address] [phone]   [facility] [address], [place] [phone]   Planned Parenthood    [phone]   [facility]    [phone]   Community [facility] and [facility]  [address], [place] Phone:  [phone], Fax:  [phone] Hours of Operation:  9 am - 6 pm, M-F.  Also accepts Medicaid/Medicare and self-pay.  [facility]  [address], [place] Phone: [phone], Fax: [phone]. Hours of Operation:  8:30 am - 5:30 pm, M-F.  Also accepts Medicaid and self-pay.  [facility] [place] [address], [place] Phone: [phone]   [facility] [address] Natasha Bence [place], [place] [phone], Ext. [phone] Mondays & Thursdays: 7-9 AM.  First 15 patients are seen on a first come, first serve basis.    Medicaid-accepting  [facility] Providers:  Organization         Address  Phone   Notes  [facility] [address], [place] [phone] Also accepts self-pay patients.  [facility] [address] Laurell Josephs [place], [place]  [phone]   [facility] [address], [place] [phone]   [facility] [address], [place] [phone]   Renaye Rakers [address], [place]   [phone] Only accepts [place] Access [place] patients after they have their name applied to their card.   Self-Pay (no insurance) in [facility]:  Organization         Address  Phone   Notes  Sickle Cell Patients, [facility] [address], [place] [phone]) [phone]   [facility] [address], [place] [phone]) [phone]   [facility]  [address], [place] [phone]) [phone]   [facility]/Dr. Osei-Bonsu  [address] [place] [address], [place] or [phone], [place] [phone] Phone number for both [place] and [place] locations is the same.  [facility] [address], [place] [phone]) [phone]   [facility] [place] [address] [place] [address], [place] or [address] [phone]   [facility] [address], [place] [phone]) [phone], phone; [phone]) [phone], fax Sees patients 1st and 3rd Saturday of every month.  Must not qualify for public or private insurance (i.e. Medicaid, Medicare, [place] Health Choice, Veterans' Benefits)  Household income should be no more than 200% of the poverty level The clinic cannot treat you if you are pregnant or think you are pregnant  Sexually transmitted diseases are not treated at the clinic.    Dental Care: Organization         Address  Phone  Notes  [facility]  [facility] [address], [place] [phone] Accepts children up to age 41 who are enrolled in [place] or [place] Health Choice; pregnant women with a Medicaid card; and children who have applied for Medicaid or [place] Health Choice, but were declined, whose parents can pay a reduced fee at time of service.  [facility] [place]  [address], [place] [phone]) [phone] Accepts children up to age 41 who are enrolled in [place] or [place] Health Choice; pregnant women with a Medicaid card; and children who have applied for Medicaid or  Health Choice, but were declined, whose parents can pay a reduced fee at time of service.  [facility]  [address], [place] [phone] Patients are seen by appointment only. Walk-ins are not accepted. [facility] will see patients 41 years of age and older. Monday - Tuesday (8am-5pm) Most Wednesdays (8:30-5pm) $30 per visit, cash only  [facility]  [address], [facility] [phone] Patients are seen by appointment only. Walk-ins are not accepted. [facility] will see patients 41 years of age and older. One Wednesday Evening (Monthly: Volunteer Based).  $30 per visit, cash only  [facility]  [phone]) [phone] for adults; Children under age 324, call Graduate Pediatric Dentistry at [phone]. Children aged 984-14, please call [phone] to request a pediatric application.  Dental services are provided in all areas of dental care including fillings, crowns and bridges, complete and partial dentures, implants, gum treatment, root canals, and extractions. Preventive care is also provided. Treatment is provided to both adults and children. Patients are selected via a lottery and there is often a waiting list.   [facility] [address], [place]  [phone] [email or web address]   [facility] [address], [place], [place] [phone], Ext. [phone] Second and Fourth Thursday of each month, opens at 6:30 AM; Clinic ends at 9 AM.  Patients are seen on a first-come first-served basis, and a limited number are seen during each clinic.   [facility]  [address], [place], [place] [phone]   Eligibility Requirements You must have lived in [place], [place],  or [place] counties for at least the last three months.   You cannot be eligible for state or federal sponsored [place], including [employer], [place], or [employer].   You generally cannot be eligible for healthcare insurance through your employer.    How to apply: Eligibility screenings are held every Tuesday and Wednesday afternoon from 1:00 pm until 4:00 pm. You do not need an appointment for the interview!  [facility] [address] [place], [place] [phone]   [facility] Health Department  [phone]   [facility] Health Department  [phone]   [facility] Health Department  [phone]    Behavioral Health Resources in the Community: Intensive Outpatient Programs Organization         Address  Phone  Notes  [facility] [address] [place], [place] [phone]   [facility] Outpatient [address] [place], [place] [phone]   [facility] [address] [place], [place]  [phone]   [facility] Mental Health [address],  [place], [place] [phone] or [phone]   Substance Abuse Resources Organization         Address  Phone  Notes  Al[facility] Drug Services  [phone]   [facility]  [phone]   [facility] [place]  [phone]   Floydene Flock  [phone]   Residential & Outpatient Substance Abuse Program  [phone]   Psychological Services Organization         Address  Phone  Notes  [facility] Behavioral Health   [phone]   [facility] Services  [phone]   [facility] [address] [place] [phone] or [phone]    Mobile Crisis Teams Organization         Address  Phone  Notes  [facility], Mobile Crisis Care Unit  [phone]   [facility]  [address] [place], [place] [phone]   Doristine Locks [address] [place] [phone]    Self-Help/Support Groups Organization         Address  Phone             Notes  [facility] [place] - variety of support groups  [phone]- I7437963 Call for more information  Narcotics Anonymous (NA), [facility] [address], [employer] [place]  2 meetings at this location   Statistician         Address  Phone  Notes  [facility] [address],    [place]  [phone]   [facility]  [address] [place] [address], [place], [place] [phone]   [facility] [address] [place], [place] [phone] Admissions: 8am-3pm M-F  [facility] [address],    [place], [place] [phone]   [facility] [address] [place], [place], [place] [phone]   [facility] [address],  [place], [place] [phone]   [facility] - Intensive Outpatient [address]ure[address] Joseph[address], [place], [place] [phone]   [facility] (Addiction Recovery [facility] [address]  [place], [place] [phone] or [phone]   [facility] ([facility]) [address], [place], [place] [phone] Accepts Medicaid  [facility] [place] [facility] [address]  [place] [phone] Substance Abuse/Addiction Treatment   [facility] Resources Organization         Address  Phone  Notes  [facility]  [phone]   Angie Fava, PhD [address] Ervin Knack [place], [place]   [phone] or  [phone]   [facility]al   [address] [place], [place] [phone]   [facility] Recovery [address], [place], [place] [phone] Insurance/Medicaid/sponsorship through [facility] and Families [address]                                    [place], [place] [phone] Therapy/tele-psych/case  [facility] [address] [place], [place] [phone]    Dr. Lolly Mustache  [phone]   [facility]  [employer] [facility] Dept. 1) [address] [place] 2) [address] [place] 3)  [address], [place] [phone]  [phone]   [facility] Child Abuse Hotline [phone] or [phone] (After Hours)

## 2015-11-22 NOTE — ED Notes (Signed)
Pt has been out of HTN meds over 2 months

## 2015-11-22 NOTE — ED Notes (Signed)
Patient c/o hypertension and dysuria/frequency x1 month. States she is in the process of switching her insurance and hasn't had her BP medication in two months. Patient also c/o of "bump" under left breast. Small black bump noted, no redness or drainage.

## 2015-11-23 LAB — RPR: RPR: REACTIVE — AB

## 2015-11-23 LAB — GC/CHLAMYDIA PROBE AMP (~~LOC~~) NOT AT ARMC
CHLAMYDIA, DNA PROBE: NEGATIVE
NEISSERIA GONORRHEA: NEGATIVE

## 2015-11-23 LAB — RPR, QUANT+TP ABS (REFLEX)
Rapid Plasma Reagin, Quant: 1:1 {titer} — ABNORMAL HIGH
T Pallidum Abs: POSITIVE — AB

## 2015-11-23 LAB — HIV ANTIBODY (ROUTINE TESTING W REFLEX): HIV SCREEN 4TH GENERATION: NONREACTIVE

## 2015-11-24 ENCOUNTER — Telehealth (HOSPITAL_BASED_OUTPATIENT_CLINIC_OR_DEPARTMENT_OTHER): Payer: Self-pay | Admitting: Emergency Medicine

## 2015-11-24 NOTE — Telephone Encounter (Signed)
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

## 2016-01-14 ENCOUNTER — Emergency Department (HOSPITAL_COMMUNITY)
Admission: EM | Admit: 2016-01-14 | Discharge: 2016-01-16 | Disposition: A | Discharge status: Home / Self Care | Payer: Medicaid Other | Attending: Emergency Medicine | Admitting: Emergency Medicine

## 2016-01-14 DIAGNOSIS — F1420 Cocaine dependence, uncomplicated: Secondary | ICD-10-CM | POA: Diagnosis present

## 2016-01-14 DIAGNOSIS — F17210 Nicotine dependence, cigarettes, uncomplicated: Secondary | ICD-10-CM | POA: Insufficient documentation

## 2016-01-14 DIAGNOSIS — I10 Essential (primary) hypertension: Secondary | ICD-10-CM | POA: Insufficient documentation

## 2016-01-14 DIAGNOSIS — Z79899 Other long term (current) drug therapy: Secondary | ICD-10-CM | POA: Insufficient documentation

## 2016-01-14 DIAGNOSIS — F1410 Cocaine abuse, uncomplicated: Secondary | ICD-10-CM | POA: Insufficient documentation

## 2016-01-14 DIAGNOSIS — R440 Auditory hallucinations: Secondary | ICD-10-CM | POA: Insufficient documentation

## 2016-01-14 DIAGNOSIS — F329 Major depressive disorder, single episode, unspecified: Secondary | ICD-10-CM | POA: Insufficient documentation

## 2016-01-14 DIAGNOSIS — R451 Restlessness and agitation: Secondary | ICD-10-CM | POA: Insufficient documentation

## 2016-01-14 DIAGNOSIS — Z8619 Personal history of other infectious and parasitic diseases: Secondary | ICD-10-CM | POA: Insufficient documentation

## 2016-01-14 DIAGNOSIS — R441 Visual hallucinations: Secondary | ICD-10-CM | POA: Insufficient documentation

## 2016-01-14 DIAGNOSIS — F32A Depression, unspecified: Secondary | ICD-10-CM

## 2016-01-14 DIAGNOSIS — F1494 Cocaine use, unspecified with cocaine-induced mood disorder: Secondary | ICD-10-CM | POA: Diagnosis present

## 2016-01-14 DIAGNOSIS — Z3202 Encounter for pregnancy test, result negative: Secondary | ICD-10-CM | POA: Insufficient documentation

## 2016-01-14 DIAGNOSIS — Z8611 Personal history of tuberculosis: Secondary | ICD-10-CM | POA: Insufficient documentation

## 2016-01-15 ENCOUNTER — Encounter (HOSPITAL_COMMUNITY): Payer: Self-pay | Admitting: *Deleted

## 2016-01-15 DIAGNOSIS — F1410 Cocaine abuse, uncomplicated: Secondary | ICD-10-CM | POA: Insufficient documentation

## 2016-01-15 DIAGNOSIS — R45851 Suicidal ideations: Secondary | ICD-10-CM

## 2016-01-15 DIAGNOSIS — F1420 Cocaine dependence, uncomplicated: Secondary | ICD-10-CM | POA: Diagnosis present

## 2016-01-15 DIAGNOSIS — F1494 Cocaine use, unspecified with cocaine-induced mood disorder: Secondary | ICD-10-CM | POA: Diagnosis present

## 2016-01-15 LAB — COMPREHENSIVE METABOLIC PANEL
ALBUMIN: 3.8 g/dL (ref 3.5–5.0)
ALT: 18 U/L (ref 14–54)
AST: 25 U/L (ref 15–41)
Alkaline Phosphatase: 71 U/L (ref 38–126)
Anion gap: 7 (ref 5–15)
BUN: 14 mg/dL (ref 6–20)
CALCIUM: 8.9 mg/dL (ref 8.9–10.3)
CO2: 26 mmol/L (ref 22–32)
Chloride: 104 mmol/L (ref 101–111)
Creatinine, Ser: 0.92 mg/dL (ref 0.44–1.00)
GFR calc Af Amer: >60 mL/min (ref >60)
GFR calc non Af Amer: >60 mL/min (ref >60)
GLUCOSE: 94 mg/dL (ref 65–99)
Potassium: 4.1 mmol/L (ref 3.5–5.1)
SODIUM: 137 mmol/L (ref 135–145)
Total Bilirubin: 0.7 mg/dL (ref 0.3–1.2)
Total Protein: 7.5 g/dL (ref 6.5–8.1)

## 2016-01-15 LAB — CBC
HCT: 41.1 % (ref 36.0–46.0)
HEMOGLOBIN: 13.8 g/dL (ref 12.0–15.0)
MCH: 32.9 pg (ref 26.0–34.0)
MCHC: 33.6 g/dL (ref 30.0–36.0)
MCV: 97.9 fL (ref 78.0–100.0)
Platelets: 245 10*3/uL (ref 150–400)
RBC: 4.2 MIL/uL (ref 3.87–5.11)
RDW: 13.3 % (ref 11.5–15.5)
WBC: 7.2 10*3/uL (ref 4.0–10.5)

## 2016-01-15 LAB — RAPID URINE DRUG SCREEN, HOSP PERFORMED
Amphetamines: NOT DETECTED
BENZODIAZEPINES: NOT DETECTED
Barbiturates: NOT DETECTED
COCAINE: POSITIVE — AB
OPIATES: NOT DETECTED
Tetrahydrocannabinol: NOT DETECTED

## 2016-01-15 LAB — ACETAMINOPHEN LEVEL

## 2016-01-15 LAB — I-STAT BETA HCG BLOOD, ED (MC, WL, AP ONLY): I-stat hCG, quantitative: <5 m[IU]/mL (ref <5)

## 2016-01-15 LAB — SALICYLATE LEVEL: Salicylate Lvl: <4 mg/dL (ref 2.8–30.0)

## 2016-01-15 LAB — ETHANOL: Alcohol, Ethyl (B): <5 mg/dL (ref <5)

## 2016-01-15 MED ORDER — METRONIDAZOLE 500 MG PO TABS
500.0000 mg | ORAL_TABLET | Freq: Two times a day (BID) | ORAL | Status: DC
  Administered 2016-01-15 – 2016-01-16 (×3): 500 mg via ORAL
  Filled 2016-01-15 (×3): qty 1

## 2016-01-15 MED ORDER — FLUCONAZOLE 150 MG PO TABS
150.0000 mg | ORAL_TABLET | Freq: Once | ORAL | Status: AC
  Administered 2016-01-15: 150 mg via ORAL
  Filled 2016-01-15: qty 1

## 2016-01-15 MED ORDER — LOSARTAN POTASSIUM 50 MG PO TABS
100.0000 mg | ORAL_TABLET | Freq: Every day | ORAL | Status: DC
  Administered 2016-01-15 – 2016-01-16 (×2): 100 mg via ORAL
  Filled 2016-01-15 (×2): qty 2

## 2016-01-15 MED ORDER — LORAZEPAM 1 MG PO TABS
1.0000 mg | ORAL_TABLET | Freq: Three times a day (TID) | ORAL | Status: DC | PRN
  Administered 2016-01-15: 1 mg via ORAL
  Filled 2016-01-15: qty 1

## 2016-01-15 MED ORDER — ONDANSETRON HCL 4 MG PO TABS: 4.0000 mg | ORAL_TABLET | Freq: Three times a day (TID) | ORAL | Status: DC | PRN

## 2016-01-15 MED ORDER — NICOTINE 21 MG/24HR TD PT24
21.0000 mg | MEDICATED_PATCH | Freq: Every day | TRANSDERMAL | Status: DC
  Filled 2016-01-15 (×2): qty 1

## 2016-01-15 MED ORDER — FLUOXETINE HCL 20 MG PO CAPS
20.0000 mg | ORAL_CAPSULE | Freq: Every day | ORAL | Status: DC
  Administered 2016-01-15 – 2016-01-16 (×2): 20 mg via ORAL
  Filled 2016-01-15 (×2): qty 1

## 2016-01-15 MED ORDER — ZOLPIDEM TARTRATE 10 MG PO TABS
10.0000 mg | ORAL_TABLET | Freq: Every evening | ORAL | Status: DC | PRN
  Administered 2016-01-15 (×2): 10 mg via ORAL
  Filled 2016-01-15 (×2): qty 1

## 2016-01-15 MED ORDER — ALUM & MAG HYDROXIDE-SIMETH 200-200-20 MG/5ML PO SUSP
30.0000 mL | ORAL | Status: DC | PRN
  Administered 2016-01-15: 30 mL via ORAL
  Filled 2016-01-15: qty 30

## 2016-01-15 MED ORDER — IBUPROFEN 200 MG PO TABS
600.0000 mg | ORAL_TABLET | Freq: Three times a day (TID) | ORAL | Status: DC | PRN
  Administered 2016-01-15: 600 mg via ORAL
  Filled 2016-01-15: qty 3

## 2016-01-15 NOTE — ED Notes (Signed)
Patient appears cooperative. Reports SI and VH. Denies HI. Rates feelings of anxiety 5/10, and high depression. Has ongoing stomach discomfort.  Encouragement offered. Medications reviewed. Snack provided.  Q 15 safety checks continue.

## 2016-01-15 NOTE — ED Notes (Signed)
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 She also drinks alcohol - at least a beer a day. Her affect is flat and she feels despair and hopelessness. She said that she has a large family history of mental illness. Sometimes, even when she is not using drugs or alcohol, she has mild hallucinations, like seeing the floor rise up. She is cooperative and is staying in bed this morning.

## 2016-01-15 NOTE — ED Notes (Signed)
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

## 2016-01-15 NOTE — ED Provider Notes (Signed)
CSN: [id number]     Arrival date & time 01/14/16  2347 History   First MD Initiated Contact with Patient 01/15/16 0110     Chief Complaint  Patient presents with  . Suicidal     (Consider location/radiation/quality/duration/timing/severity/associated sxs/prior Treatment) HPI patient reports she grew up in a family where there was a lot of mental disease. She states her uncle committed suicide and she had at least 3 family members who were committed to mental institutions. She states she grew up seeing family members chasing each other around with butcher knives. She feels like she has had mental issues for a long time but she has never been evaluated or treated for. She also reports a problem with crack/cocaine and states she was sober for about 3 years however she is started using again over the past 2 months. Patient states she is "scared of everything". She states sometimes she feels like the floor is moving up and when she tries to type she feels like the buttons are moving around on the keyboard. Feels like people or talking about her however she will not tell me what they say. She also feels like her hair is moving by itself. She states she's depressed and she states she would be suicidal if she doesn't get help and states she would run out into traffic. She denies any homicidal ideation. She states things seem to get worse once she found out she was not getting custody of her children back. She has an 82 year old, 89 year old, and an 42 year old. Patient also reports things have gotten so bad that she was recently fired from her job working at [employer].  PCP none  Past Medical History  Diagnosis Date  . Hep C w/o coma, chronic (HCC)   . Hypertension   . Tuberculosis of lung, nodular 1999  . Syphilis    Past Surgical History  Procedure Laterality Date  . Appendectomy    . Tubal ligation    . Examination under anesthesia  12/15/2011    Procedure: EXAM UNDER ANESTHESIA;  Surgeon: Rulon Abide, DO;  Location: [facility];  Service: General;  Laterality: N/A;  incison and drainage of peri rectal abcess  . Rectal surgery     No family history on file. Social History  Substance Use Topics  . Smoking status: Current Some Day Smoker -- 27 years    Types: Cigarettes  . Smokeless tobacco: Never Used  . Alcohol Use: 0.0 oz/week    0 Standard drinks or equivalent per week     Comment: binge drinking   Unemployed Drinks 1-2 beers a week Last used cocaine last night  OB History    No data available     Review of Systems  All other systems reviewed and are negative.     Allergies  Tylenol  Home Medications   Prior to Admission medications   Medication Sig Start Date End Date Taking? Authorizing Provider  losartan (COZAAR) 100 MG tablet Take 1 tablet (100 mg total) by mouth daily. 11/22/15  Yes Kayla Rose, PA-C  metroNIDAZOLE (FLAGYL) 500 MG tablet Take 1 tablet (500 mg total) by mouth 2 (two) times daily. Patient not taking: Reported on 01/15/2016 11/22/15   Cheri Fowler, PA-C  ondansetron (ZOFRAN ODT) 8 MG disintegrating tablet Take 1 tablet (8 mg total) by mouth every 8 (eight) hours as needed for nausea or vomiting. Patient not taking: Reported on 11/22/2015 12/03/14   Azalia Bilis, MD  traMADol (ULTRAM) 50 MG tablet Take 1  tablet (50 mg total) by mouth every 6 (six) hours as needed. Patient not taking: Reported on 11/22/2015 08/04/15   Earley Favor, NP   BP 135/117 mmHg  Pulse 86  Temp(Src) 98.5 F (36.9 C) (Oral)  Resp 20  SpO2 100%  LMP 01/01/2016  Vital signs normal except for hypertension  Physical Exam  Constitutional: She is oriented to person, place, and time. She appears well-developed and well-nourished.  Non-toxic appearance. She does not appear ill. No distress.  HENT:  Head: Normocephalic and atraumatic.  Right Ear: External ear normal.  Left Ear: External ear normal.  Nose: Nose normal. No mucosal edema or rhinorrhea.  Mouth/Throat:  Oropharynx is clear and moist and mucous membranes are normal. No dental abscesses or uvula swelling.  Eyes: Conjunctivae and EOM are normal. Pupils are equal, round, and reactive to light.  Neck: Normal range of motion and full passive range of motion without pain. Neck supple.  Cardiovascular: Normal rate, regular rhythm and normal heart sounds.  Exam reveals no gallop and no friction rub.   No murmur heard. Pulmonary/Chest: Effort normal and breath sounds normal. No respiratory distress. She has no wheezes. She has no rhonchi. She has no rales. She exhibits no tenderness and no crepitus.  Abdominal: Soft. Normal appearance and bowel sounds are normal. She exhibits no distension. There is no tenderness. There is no rebound and no guarding.  Musculoskeletal: Normal range of motion. She exhibits no edema or tenderness.  Moves all extremities well.   Neurological: She is alert and oriented to person, place, and time. She has normal strength. No cranial nerve deficit.  Skin: Skin is warm, dry and intact. No rash noted. No erythema. No pallor.  Psychiatric: Her mood appears not anxious. Her speech is rapid and/or pressured. She is agitated.  Tearful, patient does not appear to be hearing voices during our interview.  Nursing note and vitals reviewed.   ED Course  Procedures (including critical care time)  Medications  LORazepam (ATIVAN) tablet 1 mg (not administered)  ibuprofen (ADVIL,MOTRIN) tablet 600 mg (not administered)  zolpidem (AMBIEN) tablet 10 mg (not administered)  nicotine (NICODERM CQ - dosed in mg/24 hours) patch 21 mg (not administered)  ondansetron (ZOFRAN) tablet 4 mg (not administered)  alum & mag hydroxide-simeth (MAALOX/MYLANTA) 200-200-20 MG/5ML suspension 30 mL (not administered)     01:45 Psych holding orders written. TSS consult ordered  TSS consult has been done and recommendation is for inpatient psychiatric admission.  Labs Review  Results for orders placed  or performed during the hospital encounter of 01/14/16  Ethanol (ETOH)  Result Value Ref Range   Alcohol, Ethyl (B) <5 <5 mg/dL  Salicylate level  Result Value Ref Range   Salicylate Lvl <4.0 2.8 - 30.0 mg/dL  Acetaminophen level  Result Value Ref Range   Acetaminophen (Tylenol), Serum <10 (L) 10 - 30 ug/mL  CBC  Result Value Ref Range   WBC 7.2 4.0 - 10.5 K/uL   RBC 4.20 3.87 - 5.11 MIL/uL   Hemoglobin 13.8 12.0 - 15.0 g/dL   HCT 19.1 47.8 - 29.5 %   MCV 97.9 78.0 - 100.0 fL   MCH 32.9 26.0 - 34.0 pg   MCHC 33.6 30.0 - 36.0 g/dL   RDW 62.1 30.8 - 65.7 %   Platelets 245 150 - 400 K/uL  Comprehensive metabolic panel  Result Value Ref Range   Sodium 137 135 - 145 mmol/L   Potassium 4.1 3.5 - 5.1 mmol/L   Chloride 104  101 - 111 mmol/L   CO2 26 22 - 32 mmol/L   Glucose, Bld 94 65 - 99 mg/dL   BUN 14 6 - 20 mg/dL   Creatinine, Ser 1.61 0.44 - 1.00 mg/dL   Calcium 8.9 8.9 - 09.6 mg/dL   Total Protein 7.5 6.5 - 8.1 g/dL   Albumin 3.8 3.5 - 5.0 g/dL   AST 25 15 - 41 U/L   ALT 18 14 - 54 U/L   Alkaline Phosphatase 71 38 - 126 U/L   Total Bilirubin 0.7 0.3 - 1.2 mg/dL   GFR calc non Af Amer >60 >60 mL/min   GFR calc Af Amer >60 >60 mL/min   Anion gap 7 5 - 15  I-Stat beta hCG blood, ED ([facility], [facility], [facility] only)  Result Value Ref Range   I-stat hCG, quantitative <5.0 <5 mIU/mL   Comment 3           Laboratory interpretation all normal      MDM   Final diagnoses:  Visual hallucinations  Auditory hallucinations  Depression  Cocaine abuse    Disposition inpatient psychiatric admission  Devoria Albe, MD, Concha Pyo, MD 01/15/16 [phone]
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
## 2016-01-15 NOTE — ED Notes (Signed)
Patient appears cooperative. Reports SI and VH. Denies HI. States "I need some help". Reports family history of mental health problems and states that she feels the problems are increasing in her own life. Reports insomnia and poor appetite in recent weeks.  Encouragement offered. Oriented to the unit. Given Motrin, Ambien, Maalox.  Q 15 safety checks in place.

## 2016-01-15 NOTE — ED Notes (Signed)
Personal paperback book in room with patient
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
## 2016-01-16 DIAGNOSIS — F1494 Cocaine use, unspecified with cocaine-induced mood disorder: Secondary | ICD-10-CM

## 2016-01-16 MED ORDER — METRONIDAZOLE 500 MG PO TABS: 500.0000 mg | ORAL_TABLET | Freq: Two times a day (BID) | ORAL | Status: DC

## 2016-01-16 MED ORDER — NICOTINE 21 MG/24HR TD PT24: 21.0000 mg | MEDICATED_PATCH | Freq: Every day | TRANSDERMAL | Status: DC

## 2016-01-16 MED ORDER — FLUOXETINE HCL 20 MG PO CAPS: 20.0000 mg | ORAL_CAPSULE | Freq: Every day | ORAL | Status: DC

## 2016-01-16 NOTE — ED Notes (Signed)
Patient discharged with resources for housing and for drug treatment.  She was able to verbalize understanding of all information given.  She was given prescriptions for psych medications.  She as given a bus pass.  She left the unit ambulatory and all belongings were returned and signed for.

## 2016-01-16 NOTE — Discharge Instructions (Addendum)
For your ongoing mental health needs, you are advised to follow up with [facility].  New and returning patients are seen at their walk-in clinic.  Walk-in hours are Monday - Friday from 8:00 am - 3:00 pm.  Walk-in patients are seen on a first come, first served basis.  Try to arrive as early as possible for he best chance of being seen the same day:       [facility]      [address]      [place], [place] [address]      [phone]  To help you maintain a sober lifestyle, a substance abuse treatment program may be beneficial to you.  Contact one of the following programs at your earliest opportunity ask about enrolling:  RESIDENTIAL PROGRAMS:       [facility]      [address] [place], [place] [address]      [phone]       [facility]      [address] [place], [place] [address]      [phone]  OUTPATIENT PROGRAMS:       [facility]      [address]      [place], [place] [address]      [phone]      New patients are seen at the walk-in clinic every Tuesday from 9:00 am - 12:00 pm.

## 2016-01-16 NOTE — BH Assessment (Addendum)
[facility] Assessment Progress Note  Per Thedore Mins, MD, this pt does not require psychiatric hospitalization at this time. Pt is to be discharged with referral information for [facility]. This information has been included in pt's discharge instructions. Pt's nurse, Dawnaly, has been notified.   Brittany Canning, MA  Triage Specialist  [phone]   Addendum:  Pt requests substance abuse treatment information.  Discharge instructions include contact information for [facility], [facility], and [facility].  Pt's nurse, Dawnaly, has been notified.  Brittany Canning, MA Triage Specialist [phone]

## 2016-01-16 NOTE — Consult Note (Signed)
[facility] Consult   Reason for Consult: Cocaine abuse, depression.  Referring Physician:  EDP Patient Identification: Brittany Duarte MRN:  [id number] Principal Diagnosis: Cocaine-induced mood disorder [facility]) Diagnosis:   Patient Active Problem List   Diagnosis Date Noted  . Cocaine-induced mood disorder ([place]) [F14.94] 01/15/2016    Priority: Medium  . Cocaine dependence ([place]) [F14.20] 01/15/2016  . Cocaine abuse [F14.10]   . Pain in lower limb [M79.606] 07/13/2014  . Metatarsalgia of right foot [M77.41] 07/08/2014  . 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 Metatarsal deformity [M21.969] 07/08/2014    Total Time spent with patient: 45 minutes  Subjective:   Brittany Duarte is a 40 y.o. female patient admitted with Cocaine abuse, depression.  HPI:  AA female, 40 years old was evaluated for seeking detox from Cocaine.  Patient on arrival yesterday reported that he was suicidal if she did not receive detox treatment.  Patient stated that was clean  For 3 years and relapsed in the past 2-3 months.  Patient reports that she is homeless and lost her home due to Cocaine addiction.  Patient reports that she feels angry and depressed because of Cocaine use.   She reports poor sleep and appetite and states she will not go to the shelters.  Today she denies SI/HI/AVH and has agreed to seek [place] treatment at [facility]  She is discharged and given information for shelters and [place].  Past Psychiatric History:  Cocaine induced mood disorder.  Risk to Self: Suicidal Ideation: Yes-Currently Present Suicidal Intent: Yes-Currently Present Is patient at risk for suicide?: Yes Suicidal Plan?: Yes-Currently Present Specify Current Suicidal Plan: Walk into traffic Access to Means: Yes Specify Access to Suicidal Means: Access to busy roads What has been your use of drugs/alcohol within the last 12 months?: Daily crack cocaine use for the past 3 months; Occasional marijuana  use How many times?: 1 Other Self Harm Risks: SA, Homeless Triggers for Past Attempts: Unknown Intentional Self Injurious Behavior: None Risk to Others: Homicidal Ideation: No Thoughts of Harm to Others: No Current Homicidal Intent: No Current Homicidal Plan: No Access to Homicidal Means: No Identified Victim: n/a History of harm to others?: No Assessment of Violence: None Noted Violent Behavior Description: No known hx of violence Does patient have access to weapons?: No Criminal Charges Pending?: No Does patient have a court date: No Prior Inpatient Therapy: Prior Inpatient Therapy: Yes Prior Therapy Dates: Multiple Prior Therapy Facilty/Provider(s): [facility], [facility], and others Reason for Treatment: Depression/SI Prior Outpatient Therapy: Prior Outpatient Therapy: No Prior Therapy Dates: na Prior Therapy Facilty/Provider(s): na Reason for Treatment: na Does patient have an ACCT team?: No Does patient have Intensive In-House Services?  : No Does patient have [facility] services? : No Does patient have P4CC services?: No  Past Medical History:  Past Medical History  Diagnosis Date  . Hep C w/o coma, chronic ([place])   . Hypertension   . Tuberculosis of lung, nodular 1999  . Syphilis     Past Surgical History  Procedure Laterality Date  . Appendectomy    . Tubal ligation    . Examination under anesthesia  12/15/2011    Procedure: EXAM UNDER ANESTHESIA;  Surgeon: Judieth Keens, DO;  Location: [facility];  Service: General;  Laterality: N/A;  incison and drainage of peri rectal abcess  . Rectal surgery     Family History: No family history on file.   Family Psychiatric  History:   Unknown Social History:  History  Alcohol Use  . 0.0 oz/week  . 0 Standard drinks or equivalent per week    Comment: binge drinking     History  Drug Use No    Comment: Quit cocaine 2012    Social History   Social History  . Marital Status: Single    Spouse Name: N/A  .  Number of Children: N/A  . Years of Education: N/A   Social History Main Topics  . Smoking status: Current Some Day Smoker -- 27 years    Types: Cigarettes  . Smokeless tobacco: Never Used  . Alcohol Use: 0.0 oz/week    0 Standard drinks or equivalent per week     Comment: binge drinking  . Drug Use: No     Comment: Quit cocaine 2012  . Sexual Activity: Yes    Birth Control/ Protection: Surgical   Other Topics Concern  . None   Social History Narrative   Additional Social History:    Allergies:   Allergies  Allergen Reactions  . Tylenol [Acetaminophen]     Pt with hx of Hep C     Labs:  Results for orders placed or performed during the hospital encounter of 01/14/16 (from the past 48 hour(s))  Ethanol (ETOH)     Status: None   Collection Time: 01/15/16 12:51 AM  Result Value Ref Range   Alcohol, Ethyl (B) <5 <5 mg/dL    Comment:        LOWEST DETECTABLE LIMIT FOR SERUM ALCOHOL IS 5 mg/dL FOR MEDICAL PURPOSES ONLY   Salicylate level     Status: None   Collection Time: 01/15/16 12:51 AM  Result Value Ref Range   Salicylate Lvl <2.5 2.8 - 30.0 mg/dL  Acetaminophen level     Status: Abnormal   Collection Time: 01/15/16 12:51 AM  Result Value Ref Range   Acetaminophen (Tylenol), Serum <10 (L) 10 - 30 ug/mL    Comment:        THERAPEUTIC CONCENTRATIONS VARY SIGNIFICANTLY. A RANGE OF 10-30 ug/mL MAY BE AN EFFECTIVE CONCENTRATION FOR MANY PATIENTS. HOWEVER, SOME ARE BEST TREATED AT CONCENTRATIONS OUTSIDE THIS RANGE. ACETAMINOPHEN CONCENTRATIONS >150 ug/mL AT 4 HOURS AFTER INGESTION AND >50 ug/mL AT 12 HOURS AFTER INGESTION ARE OFTEN ASSOCIATED WITH TOXIC REACTIONS.   CBC     Status: None   Collection Time: 01/15/16 12:51 AM  Result Value Ref Range   WBC 7.2 4.0 - 10.5 K/uL   RBC 4.20 3.87 - 5.11 MIL/uL   Hemoglobin 13.8 12.0 - 15.0 g/dL   HCT 41.1 36.0 - 46.0 %   MCV 97.9 78.0 - 100.0 fL   MCH 32.9 26.0 - 34.0 pg   MCHC 33.6 30.0 - 36.0 g/dL   RDW  13.3 11.5 - 15.5 %   Platelets 245 150 - 400 K/uL  Comprehensive metabolic panel     Status: None   Collection Time: 01/15/16 12:51 AM  Result Value Ref Range   Sodium 137 135 - 145 mmol/L   Potassium 4.1 3.5 - 5.1 mmol/L   Chloride 104 101 - 111 mmol/L   CO2 26 22 - 32 mmol/L   Glucose, Bld 94 65 - 99 mg/dL   BUN 14 6 - 20 mg/dL   Creatinine, Ser 0.92 0.44 - 1.00 mg/dL   Calcium 8.9 8.9 - 10.3 mg/dL   Total Protein 7.5 6.5 - 8.1 g/dL   Albumin 3.8 3.5 - 5.0 g/dL   AST 25 15 - 41 U/L   ALT  18 14 - 54 U/L   Alkaline Phosphatase 71 38 - 126 U/L   Total Bilirubin 0.7 0.3 - 1.2 mg/dL   GFR calc non Af Amer >60 >60 mL/min   GFR calc Af Amer >60 >60 mL/min    Comment: (NOTE) The eGFR has been calculated using the CKD EPI equation. This calculation has not been validated in all clinical situations. eGFR's persistently <60 mL/min signify possible Chronic Kidney Disease.    Anion gap 7 5 - 15  I-Stat beta hCG blood, ED ([facility], [facility], [facility] only)     Status: None   Collection Time: 01/15/16  1:04 AM  Result Value Ref Range   I-stat hCG, quantitative <5.0 <5 mIU/mL   Comment 3            Comment:   GEST. AGE      CONC.  (mIU/mL)   <=1 WEEK        5 - 50     2 WEEKS       50 - 500     3 WEEKS       100 - 10,000     4 WEEKS     1,000 - 30,000        FEMALE AND NON-PREGNANT FEMALE:     LESS THAN 5 mIU/mL   Urine rapid drug screen (hosp performed) (Not at [facility])     Status: Abnormal   Collection Time: 01/15/16  1:29 AM  Result Value Ref Range   Opiates NONE DETECTED NONE DETECTED   Cocaine POSITIVE (A) NONE DETECTED   Benzodiazepines NONE DETECTED NONE DETECTED   Amphetamines NONE DETECTED NONE DETECTED   Tetrahydrocannabinol NONE DETECTED NONE DETECTED   Barbiturates NONE DETECTED NONE DETECTED    Comment:        DRUG SCREEN FOR MEDICAL PURPOSES ONLY.  IF CONFIRMATION IS NEEDED FOR ANY PURPOSE, NOTIFY LAB WITHIN 5 DAYS.        LOWEST DETECTABLE LIMITS FOR URINE DRUG SCREEN Drug Class        Cutoff (ng/mL) Amphetamine      1000 Barbiturate      200 Benzodiazepine   409 Tricyclics       735 Opiates          300 Cocaine          300 THC              50     Current Facility-Administered Medications  Medication Dose Route Frequency Provider Last Rate Last Dose  . alum & mag hydroxide-simeth (MAALOX/MYLANTA) 200-200-20 MG/5ML suspension 30 mL  30 mL Oral PRN Rolland Porter, MD   30 mL at 01/15/16 0224  . FLUoxetine (PROZAC) capsule 20 mg  20 mg Oral Daily Patrecia Pour, NP   20 mg at 01/16/16 1004  . ibuprofen (ADVIL,MOTRIN) tablet 600 mg  600 mg Oral Q8H PRN Rolland Porter, MD   600 mg at 01/15/16 0224  . losartan (COZAAR) tablet 100 mg  100 mg Oral Daily Patrecia Pour, NP   100 mg at 01/16/16 1004  . metroNIDAZOLE (FLAGYL) tablet 500 mg  500 mg Oral Q12H Patrecia Pour, NP   500 mg at 01/16/16 1004  . nicotine (NICODERM CQ - dosed in mg/24 hours) patch 21 mg  21 mg Transdermal Daily Rolland Porter, MD   21 mg at 01/15/16 1000  . ondansetron (ZOFRAN) tablet 4 mg  4 mg Oral Q8H PRN Rolland Porter, MD      .  zolpidem (AMBIEN) tablet 10 mg  10 mg Oral QHS PRN Rolland Porter, MD   10 mg at 01/15/16 2126   Current Outpatient Prescriptions  Medication Sig Dispense Refill  . losartan (COZAAR) 100 MG tablet Take 1 tablet (100 mg total) by mouth daily. 30 tablet 0  . metroNIDAZOLE (FLAGYL) 500 MG tablet Take 1 tablet (500 mg total) by mouth 2 (two) times daily. (Patient not taking: Reported on 01/15/2016) 14 tablet 0  . ondansetron (ZOFRAN ODT) 8 MG disintegrating tablet Take 1 tablet (8 mg total) by mouth every 8 (eight) hours as needed for nausea or vomiting. (Patient not taking: Reported on 11/22/2015) 10 tablet 0  . traMADol (ULTRAM) 50 MG tablet Take 1 tablet (50 mg total) by mouth every 6 (six) hours as needed. (Patient not taking: Reported on 11/22/2015) 15 tablet 0    Musculoskeletal: Strength & Muscle Tone: within normal limits Gait & Station: normal Patient leans: N/A  Psychiatric  Specialty Exam: Review of Systems  Constitutional: Negative.   HENT: Negative.   Eyes: Negative.   Respiratory: Negative.   Cardiovascular: Negative.   Gastrointestinal: Negative.   Genitourinary: Negative.   Skin: Negative.   Neurological: Negative.   Endo/Heme/Allergies: Negative.     Blood pressure 186/105, pulse 75, temperature 98 F (36.7 C), temperature source Oral, resp. rate 18, last menstrual period 01/01/2016, SpO2 100 %.There is no weight on file to calculate BMI.  General Appearance: Casual and Fairly Groomed  Eye Contact::  Good  Speech:  Clear and Coherent and Normal Rate  Volume:  Normal  Mood:  Angry and Depressed  Affect:  Congruent and Depressed  Thought Process:  Coherent, Goal Directed and Intact  Orientation:  Full (Time, Place, and Person)  Thought Content:  WDL  Suicidal Thoughts:  No  Homicidal Thoughts:  No  Memory:  Immediate;   Good Recent;   Good Remote;   Good  Judgement:  Fair  Insight:  Fair  Psychomotor Activity:  Normal  Concentration:  Good  Recall:  Good  Fund of Knowledge:Fair  Language: Good  Akathisia:  NA  Handed:  Right  AIMS (if indicated):     Assets:  Desire for Improvement  ADL's:  Intact  Cognition: WNL  Sleep:       Disposition:   Discharge home, follow up with [facility] for [facility] care.  Delfin Gant, NP    PMHNP-BC 01/16/2016 2:52 PM Patient seen face-to-face for psychiatric evaluation, chart reviewed and case discussed with the physician extender and developed treatment plan. Reviewed the information documented and agree with the treatment plan. Corena Pilgrim, MD

## 2016-01-16 NOTE — [facility] Suicide Risk Assessment (Cosign Needed)
Suicide Risk Assessment  Discharge Assessment   [facility] Discharge Suicide Risk Assessment   Principal Problem: Cocaine-induced mood disorder [facility]) Discharge Diagnoses:  Patient Active Problem List   Diagnosis Date Noted  . Cocaine-induced mood disorder ([facility]) [F14.94] 01/15/2016    Priority: Medium  . Cocaine dependence ([facility]) [F14.20] 01/15/2016  . Cocaine abuse [F14.10]   . Pain in lower limb [M79.606] 07/13/2014  . Metatarsalgia of right foot [M77.41] 07/08/2014  . 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 Metatarsal deformity [M21.969] 07/08/2014    Total Time spent with patient: 20 minutes  Musculoskeletal: Strength & Muscle Tone: within normal limits Gait & Station: normal Patient leans: N/A  Psychiatric Specialty Exam:   Blood pressure 186/105, pulse 75, temperature 98 F (36.7 C), temperature source Oral, resp. rate 18, last menstrual period 01/01/2016, SpO2 100 %.There is no weight on file to calculate BMI.  General Appearance: Casual and Fairly Groomed  Patent attorney::  Good  Speech:  Clear and Coherent and Normal Rate  Volume:  Normal  Mood:  Angry and Depressed  Affect:  Congruent and Depressed  Thought Process:  Coherent, Goal Directed and Intact  Orientation:  Full (Time, Place, and Person)  Thought Content:  WDL  Suicidal Thoughts:  No  Homicidal Thoughts:  No  Memory:  Immediate;   Good Recent;   Good Remote;   Good  Judgement:  Fair  Insight:  Fair  Psychomotor Activity:  Normal  Concentration:  Good  Recall:  Good  Fund of Knowledge:Fair  Language: Good  Akathisia:  NA  Handed:  Right  AIMS (if indicated):     Assets:  Desire for Improvement  ADL's:  Intact  Cognition: WNL     Mental Status Per Nursing Assessment::   On Admission:     Demographic Factors:  Low socioeconomic status, Living alone and Unemployed  Loss Factors: Financial problems/change in socioeconomic status  Historical Factors: NA  Risk Reduction  Factors:   NA  Continued Clinical Symptoms:  Alcohol/Substance Abuse/Dependencies  Cognitive Features That Contribute To Risk:  Polarized thinking    Suicide Risk:  Minimal: No identifiable suicidal ideation.  Patients presenting with no risk factors but with morbid ruminations; may be classified as minimal risk based on the severity of the depressive symptoms    Plan Of Care/Follow-up recommendations:  Activity:  as tolerated Diet:  regular  Earney Navy, NP   PMHNP-BC 01/16/2016, 3:16 PM

## 2016-01-28 ENCOUNTER — Inpatient Hospital Stay (HOSPITAL_COMMUNITY)
Admission: AD | Admit: 2016-01-28 | Discharge: 2016-01-29 | Disposition: A | Discharge status: Home / Self Care | Payer: 59 | Source: Ambulatory Visit | Attending: Obstetrics & Gynecology | Admitting: Obstetrics & Gynecology

## 2016-01-28 ENCOUNTER — Encounter (HOSPITAL_COMMUNITY): Payer: Self-pay | Admitting: *Deleted

## 2016-01-28 DIAGNOSIS — N760 Acute vaginitis: Secondary | ICD-10-CM | POA: Insufficient documentation

## 2016-01-28 DIAGNOSIS — F1420 Cocaine dependence, uncomplicated: Secondary | ICD-10-CM | POA: Insufficient documentation

## 2016-01-28 DIAGNOSIS — I10 Essential (primary) hypertension: Secondary | ICD-10-CM

## 2016-01-28 DIAGNOSIS — F17200 Nicotine dependence, unspecified, uncomplicated: Secondary | ICD-10-CM | POA: Insufficient documentation

## 2016-01-28 DIAGNOSIS — A488 Other specified bacterial diseases: Secondary | ICD-10-CM | POA: Insufficient documentation

## 2016-01-28 DIAGNOSIS — B182 Chronic viral hepatitis C: Secondary | ICD-10-CM | POA: Insufficient documentation

## 2016-01-28 DIAGNOSIS — B9689 Other specified bacterial agents as the cause of diseases classified elsewhere: Secondary | ICD-10-CM

## 2016-01-28 DIAGNOSIS — A499 Bacterial infection, unspecified: Secondary | ICD-10-CM

## 2016-01-28 DIAGNOSIS — Z8611 Personal history of tuberculosis: Secondary | ICD-10-CM | POA: Insufficient documentation

## 2016-01-28 LAB — POCT PREGNANCY, URINE: PREG TEST UR: NEGATIVE

## 2016-01-28 LAB — WET PREP, GENITAL
Sperm: NONE SEEN
TRICH WET PREP: NONE SEEN
YEAST WET PREP: NONE SEEN

## 2016-01-28 LAB — URINALYSIS, ROUTINE W REFLEX MICROSCOPIC
BILIRUBIN URINE: NEGATIVE
Glucose, UA: NEGATIVE mg/dL
HGB URINE DIPSTICK: NEGATIVE
KETONES UR: NEGATIVE mg/dL
Leukocytes, UA: NEGATIVE
NITRITE: NEGATIVE
Protein, ur: NEGATIVE mg/dL
Specific Gravity, Urine: 1.01 (ref 1.005–1.030)
pH: 5.5 (ref 5.0–8.0)

## 2016-01-28 NOTE — MAU Note (Signed)
Pt states she is here to get a preg test because she thinks she may be pregnant. Also reports her b/p is really high and she is out of her meds and she is sure she has bacterial vaginosis.

## 2016-01-28 NOTE — MAU Provider Note (Signed)
History   [id number]   Chief Complaint  Patient presents with  . Vaginal Discharge  . Possible Pregnancy  . Hypertension    HPI Brittany Duarte is a 40 y.o. female  G3P3000 here with concern of a possible pregnancy.  Also concerned about increased blood pressure.  Unable to take blood pressure medication due to no longer having any.  Also reports vaginal discharge that has been off and of "for months".  Patient admits to cocaine dependency and request for help.  Desires an inpatient facility to help break the addiction.     Patient's last menstrual period was 11/19/2015.  OB History  Gravida Para Term Preterm AB SAB TAB Ectopic Multiple Living  # Outcome Date GA Lbr Len/2nd Weight Sex Delivery Anes PTL Lv  3 Term      Vag-Spont     2 Term      Vag-Spont     1 Term      Vag-Spont         Past Medical History  Diagnosis Date  . Hep C w/o coma, chronic ([facility])   . Hypertension   . Tuberculosis of lung, nodular 1999  . Syphilis     History reviewed. No pertinent family history.  Social History   Social History  . Marital Status: Single    Spouse Name: N/A  . Number of Children: N/A  . Years of Education: N/A   Social History Main Topics  . Smoking status: Current Some Day Smoker -- 27 years    Types: Cigarettes  . Smokeless tobacco: Never Used  . Alcohol Use: 0.0 oz/week    0 Standard drinks or equivalent per week     Comment: binge drinking  . Drug Use: No     Comment: Quit cocaine 2012  . Sexual Activity: Yes    Birth Control/ Protection: Surgical   Other Topics Concern  . None   Social History Narrative    Allergies  Allergen Reactions  . Tylenol [Acetaminophen]     Pt with hx of Hep C     No current facility-administered medications on file prior to encounter.   Current Outpatient Prescriptions on File Prior to Encounter  Medication Sig Dispense Refill  . FLUoxetine (PROZAC) 20 MG capsule Take 1 capsule (20 mg total) by mouth daily.  30 capsule 0  . losartan (COZAAR) 100 MG tablet Take 1 tablet (100 mg total) by mouth daily. 30 tablet 0  . metroNIDAZOLE (FLAGYL) 500 MG tablet Take 1 tablet (500 mg total) by mouth every 12 (twelve) hours. 20 tablet 0  . nicotine (NICODERM CQ - DOSED IN MG/24 HOURS) 21 mg/24hr patch Place 1 patch (21 mg total) onto the skin daily. 28 patch 0     Review of Systems  Constitutional: Negative for fever and chills.  Eyes: Negative for visual disturbance.  Genitourinary: Positive for pelvic pain. Negative for dysuria, frequency, flank pain, vaginal bleeding, difficulty urinating and genital sores.  Musculoskeletal: Negative for back pain.  Neurological: Negative for syncope and headaches.  All other systems reviewed and are negative.    Physical Exam   Filed Vitals:   01/28/16 2210  BP: 149/109  Pulse: 62  Temp: 98.3 F (36.8 C)  TempSrc: Oral  Resp: 18  SpO2: 100%    Physical Exam  Constitutional: She is oriented to person, place, and time. She appears well-developed and well-nourished. No distress.  HENT:  Head: Normocephalic.  Eyes: EOM are normal. Pupils are equal, round, and reactive to light.  Neck: Normal range of motion. Neck supple.  Cardiovascular: Normal rate, regular rhythm and normal heart sounds.   Respiratory: Effort normal and breath sounds normal. No respiratory distress.  GI: Soft. There is no tenderness. There is no CVA tenderness.  Genitourinary: Uterus is not enlarged. Cervix exhibits no motion tenderness and no discharge. Right adnexum displays no mass and no tenderness. Left adnexum displays no mass and no tenderness. Vaginal discharge (white, creamy; +odor) found.  Musculoskeletal: Normal range of motion. She exhibits no edema.  Neurological: She is alert and oriented to person, place, and time.  Skin: Skin is warm and dry.  Psychiatric: She has a normal mood and affect.    MAU Course  Procedures Results for orders placed or performed during the  hospital encounter of 01/28/16 (from the past 24 hour(s))  Urinalysis, Routine w reflex microscopic (not at [facility])     Status: None   Collection Time: 01/28/16 10:09 PM  Result Value Ref Range   Color, Urine YELLOW YELLOW   APPearance CLEAR CLEAR   Specific Gravity, Urine 1.010 1.005 - 1.030   pH 5.5 5.0 - 8.0   Glucose, UA NEGATIVE NEGATIVE mg/dL   Hgb urine dipstick NEGATIVE NEGATIVE   Bilirubin Urine NEGATIVE NEGATIVE   Ketones, ur NEGATIVE NEGATIVE mg/dL   Protein, ur NEGATIVE NEGATIVE mg/dL   Nitrite NEGATIVE NEGATIVE   Leukocytes, UA NEGATIVE NEGATIVE  Pregnancy, urine POC     Status: None   Collection Time: 01/28/16 10:18 PM  Result Value Ref Range   Preg Test, Ur NEGATIVE NEGATIVE  Wet prep, genital     Status: Abnormal   Collection Time: 01/28/16 11:40 PM  Result Value Ref Range   Yeast Wet Prep HPF POC NONE SEEN NONE SEEN   Trich, Wet Prep NONE SEEN NONE SEEN   Clue Cells Wet Prep HPF POC PRESENT (A) NONE SEEN   WBC, Wet Prep HPF POC FEW (A) NONE SEEN   Sperm NONE SEEN    Narcotics helpline called > disconnected  [facility] Outpatient Substance abuse line called > left message  Assessment and Plan   Bacterial Vaginosis Cocaine Dependence Hypertension  Plan: Discharge home Plan to call patient with information regarding residential placement opportunities GC/CT/HIV/RPR pending (pt has history syphilis, desires to know if active now) RX HCTZ 25 mg daily RX Flagyl 500 mg BID x 7 days   Walidah N Karim, [place] 01/28/2016 10:58 PM

## 2016-01-29 LAB — HIV ANTIBODY (ROUTINE TESTING W REFLEX): HIV SCREEN 4TH GENERATION: NONREACTIVE

## 2016-01-29 MED ORDER — METRONIDAZOLE 500 MG PO TABS: 500.0000 mg | ORAL_TABLET | Freq: Two times a day (BID) | ORAL | Status: DC

## 2016-01-29 MED ORDER — HYDROCHLOROTHIAZIDE 25 MG PO TABS: 25.0000 mg | ORAL_TABLET | Freq: Every day | ORAL | Status: DC

## 2016-01-29 MED ORDER — HYDROCHLOROTHIAZIDE 25 MG PO TABS: 25.0000 mg | ORAL_TABLET | Freq: Every day | ORAL | Status: AC

## 2016-01-29 NOTE — Discharge Instructions (Signed)
[facility] [address] [place], [place] [address]        Hours of Operation  Mon - Fri: 9 a.m. - 6 p.m.  Main: [phone]   [facility] Cares  [address] [place] [address] Ph [phone] Every 2nd Saturday 9am-12pm  [email or web address] FREE Services  - [facility]  [address] [place] Ph [phone]  [address] [place] Ph [phone]  [email or web address] $45 per visit/Walk-in only  - [facility]  [address] [place] [address] Ph [phone]  1st & 3rd Saturday of each month 9:30am-12:30pm [email or web address] Sliding fee scale/Call to make an appointment  - [facility]  [address] [place] Ph [phone]  Hours Mon-Fri 9am-7pm & Sat 9am-1pm [email or web address] Visits start at $45 per visit/Call to make an appointment  [facility]  [address] [place] [address] Ph [phone]  Hours Mon-Wed 8:30am-5pm & Thurs 8:30am-8pm $5 per visit/Call for an eligibility appointment    [facility] Guide (Revised August 2014)   Chronic Pain Problems:   [facility]:  [phone]           Patients need to be referred by their primary care doctor/specialist  Insufficient Money for Medicine:           United Way: call "211"    MAP Program at [facility] Department - [place] [phone] or [place] [phone]            No Primary Care Doctor:  To locate a primary care doctor that accepts your insurance or provides certain services:           [place] Connect: [phone]           Physician Referral Service: [phone] ask for My [place]  If no insurance, you need to see if you qualify for [facility] orange card, call to set      up appointment for eligibility/enrollment at [phone] or [phone] or visit [facility]. of Health and [employer] ([address], [place] and [address] -[place]) to meet with a [facility] enrollment specialist.  Agencies that provide inexpensive (sliding fee scale) medical care:       [facility] - Family Medicine at [place] - [phone]     [facility]  -  [facility] [phone]     [facility] Internal [facility] - [phone]     [facility] Care & Wellness - [phone]     [facility] [phone]     [facility] Family Practice [phone]  [facility] - [facility] Child Health @ [place] [phone]-     [phone]  [facility] - [facility] Health @ [facility] - [phone]  [facility] Family Practice: [phone]   Women's Clinic: [phone]   Planned Parenthood: [phone]   [facility] [place]    Medicaid-accepting [facility] Providers:           [facility] [phone] (No Family Planning accepted)          [address], [phone], Mon-Fri 9am-5pm          [facility] - [phone]  [address], Mon-Thursday 8am-5pm, Fri 8am-noon  [employer] - [phone]          [address], Mon-Fri 7:30am-4:30pm          [facility] - [phone]          [address] [place] 8am-5pm          [facility] - [phone] [address]          Only accepts [place] [employer] patients after they have their name applied to their card  Self Pay (no insurance) in [facility]:           Sickle Cell Patients:   [address] [place], [phone] [facility] Internal Medicine:  [address] [place] [phone]       [facility]  [address] [place] [phone]  [facility] Family Practice:  [address] [phone]          [facility] Urgent Care           [address] [place], [phone] [facility] for Children  [address] [place], [phone]           [facility] [place]           [address], [place] [phone]        [facility] - [address]           [phone], Mon-Fri 9am-7pm, [place] 9am-1pm          [facility] - Family Medicine @ [facility]          [address] [place], [phone]          [facility] - [facility]           [address], [phone] [facility] - [facility]  [address] [place] [phone]          [facility]           [address] [phone]  [facility] - [facility]   [address] [place], [phone] [facility] - [facility]  [address] [phone]  Dr. Julio Sicks           [address], [place], [phone]          [facility]           [address], [phone]          [facility]             [address] [phone]          [facility]           [address] [place], [phone], 1st & 3rd Saturday every month, 10am-1pm  OTHERS:  [employer]  (Immigration Access Clinic Only)  [phone] (Thursday only)  Strategies for finding a Primary Care Provider:  1) Find a Doctor and Pay Out of Pocket  Although you won't have to find out who is covered by your insurance plan,  it is a good idea to ask around and get recommendations. You will then need to call the office and see if the doctor you have chosen will accept you as a new patient and what types of options they offer for patients who are self-pay. Some doctors offer discounts or will  set up payment plans for their patients who do not have insurance, but you will need to ask so you aren't surprised when you get to your appointment.  2) Contact [place] [employer] - To see if you qualify for orange card access to healthcare safety net providers.  Call for appointment for eligibility/enrollment at [phone] or [phone]. (Uninsured, 0-200% FPL, qualifying info)  Applicants for [facility] are first required to see if they are eligible to enroll in the [facility] Marketplace before enrolling in [facility] (and get an exemption if they are not).  GCCN Criteria for acceptance is:  ? Proof of ACA Marketing exemption - form or documentation  ? Valid photo ID (driver's license, state identification card, passport, home country ID)  ? Proof of [place] residency (e.g. drivers license, lease/landlord information, pay stubs with address, utility bill, bank statement, etc.)  ? Proof of income (1040, last year's tax return, W2, 4 current pay stubs, other income proof)  ? Proof of assets (current bank statement + 3 most recent, disability paperwork, life insurance info, tax value on autos, etc.)  3) Contact Your Local Health Department  Not all health departments have doctors that can see patients for sick visits, but many do, so it is worth a call to see if yours does. If you don't know where your local health department is, you can check in your phone book. The CDC also has a tool to help you locate your state's health department, and many state websites also have listings of all of their local health departments.  4) Find a Walk-in Clinic  If your illness is not likely to be very severe or complicated, you may want to try a walk in clinic. These are popping up all over the country in pharmacies, drugstores, and shopping centers. They're usually staffed by nurse practitioners or physician assistants that have been trained to treat common illnesses and complaints. They're usually fairly  quick and inexpensive. However, if you have serious medical issues or chronic medical problems, these are probably not your best option   STD Testing:           [facility] [place], [place] [facility]           [address], [place], phone [phone] or [phone]           Monday - Friday, call for an appointment          [facility] of [place] [facility], [place] [facility]           [address], [place], phone [phone] or [phone]           Monday - Friday, call for an appointment Abuse/Neglect:           [facility] Child Abuse Hotline: 941-831-8682           [facility] Child Abuse Hotline: 442-212-0579 (After Hours)  Emergency Shelter:  [facility] [phone]  [facility] [place]- [phone]  [facility] [place] - [phone]  Youth Focus - Act Together - [phone] (ages 11-17)  Homeless Day Shelter @ Loss adjuster, chartered - [phone])  [phone]   Mammograms - Free at [facility] - [phone]  Maternity Homes:           Room at the [place] [facility]: [phone]   (Homeless mother with children)          [facility] Alert Services: [phone] (Mothers only)  Youth Focus: [phone] (Pregnant 87-2 years old)  Adopt a Mom [phone]  [facility]   [facility] - Lanae Boast  [address], [place] [phone]          [facility] [place]           [address] [place]           [phone]          [place]           [address] [place], [place]           [phone]          [facility]           [address], [place]           [phone]          [facility] Mental Health           [phone]          [facility]           [phone]          [facility] in [place]            [address]           [phone], [facility] Child Abuse Hotline           [phone]           [phone] (After Hours)  Behavioral Health Services /Substance Abuse Resources:           Alcohol and Drug Services: [phone]           [facility]: [phone]          [facility] - Behavioral Health Services: [phone]   Narcotics Helpline - [phone]          [facility]: [phone]           Residential & Outpatient Substance Abuse Program - [facility] [place]: [phone]  [employer]  Behavioral Health and Wellness Center - [phone]  Psychological Services:          [facility]: [phone]   Therapeutic Alternatives: [phone]          [facility] Mental Health           [address], [place]           ACCESS LINE: [phone]     (24 Hour)  Mobile Crisis:   HELPLINES:  Financial risk analyst on Mental Illness - [place] [phone] [facility] on Mental Illness - [place] [phone]  Walk In [facility] - [address] - [place]  [phone]       [facility] - [phone] or [phone]  [facility] - [phone] [address] - [employer] [phone]  [facility] [phone]. [address], [place] [phone]  Dental Assistance:  If unable to pay or uninsured, contact: [facility]. to become qualified for the adult dental clinic. Patient must be enrolled in [facility] (uninsured, 0-200% FPL, qualifying info).  Enroll in [facility] first, then see Primary Care Physician assigned to you, the PCP makes a dental referral. [facility] will receive referral and contacts patient for appointment.  Patients with Medicaid           [address], [phone]  [facility] (Children up to 20 + Pregnant Women) - [phone]  [facility] - [address] - [address] [phone]  If unable to pay, or uninsured: contact [facility] Department [phone] in [place] - (Children only + Pregnant Women), [phone] in [facility]- Children only) to become qualified for the adult dental clinic  Must see if eligible to enroll in [facility] Marketplace before enrolling into the [facility] (exemption required) [phone] for an appointment)  BigFaster.co.uk;   [phone].  If not eligible for ACA, then go by Department of Health and Human Services to see if eligible for orange card.  [address] [place] and [phone]  Once you get an orange card, you will have a Primary Care home who will then refer you to dental if needed.        Other IT consultant:   [facility] - [phone] (ext [phone])   [address]  Dr. Lawrence Marseilles - [phone]   [address]    [place] - [phone]   [facility]           [address] [place], [place], [place], [address]           [phone], Ext. [phone]           2nd and 4th Thursday of the month at 6:30am (Simple extractions only - no wisdom teeth or surgery) First come/First serve -First 10 clients served           [facility] [place], [place] and [place] residents only)          [address] [place], [place], [address]           [phone]                    [facility] Health Department           [phone]          [facility] Health Department          [phone]         [facility] - [facility]          [phone]   Transportation Options:  Ambulance - 911 - [phone] per ride Family Member to accompany patient (if stable) [place] [employer] - [phone]  PART - [phone]  Taxi - [phone] - [place]  SCAT - [phone] (Application required)  [facility] - [phone]    Bacterial Vaginosis Bacterial vaginosis is a vaginal infection that occurs when the normal balance of bacteria in the vagina is disrupted. It results from an overgrowth of certain bacteria. This is the most common vaginal infection in women of childbearing age. Treatment is important to prevent complications, especially in pregnant women, as it can  cause a premature delivery. CAUSES  Bacterial vaginosis is caused by an increase in harmful bacteria that are normally present in smaller amounts in the vagina. Several different kinds of bacteria can cause bacterial vaginosis. However, the reason that the condition develops is not fully understood. RISK FACTORS Certain activities or behaviors can put you at an increased risk of developing bacterial vaginosis, including:  Having a new sex partner or multiple sex partners.  Douching.  Using an intrauterine device (IUD) for contraception. Women do not get bacterial vaginosis from toilet seats, bedding, swimming pools, or contact with objects around them. SIGNS AND SYMPTOMS  Some women with bacterial vaginosis have no signs or symptoms. Common symptoms include:  Grey vaginal discharge.  A fishlike odor with discharge, especially after sexual intercourse.  Itching or burning of the vagina and vulva.  Burning or pain with urination. DIAGNOSIS  Your health care provider will take a medical history and examine the vagina for signs of bacterial vaginosis. A sample of vaginal fluid may be taken. Your health care provider will look at this sample under a microscope to check for bacteria and abnormal cells. A vaginal pH test may also be done.  TREATMENT  Bacterial vaginosis may be treated with antibiotic medicines. These may be given in the form of a pill or a vaginal cream. A second round of antibiotics may be prescribed if the condition comes back after treatment. Because bacterial vaginosis increases your risk for sexually transmitted  diseases, getting treated can help reduce your risk for chlamydia, gonorrhea, HIV, and herpes. HOME CARE INSTRUCTIONS   Only take over-the-counter or prescription medicines as directed by your health care provider.  If antibiotic medicine was prescribed, take it as directed. Make sure you finish it even if you start to feel better.  Tell all sexual partners that you have a vaginal infection. They should see their health care provider and be treated if they have problems, such as a mild rash or itching.  During treatment, it is important that you follow these instructions:  Avoid sexual activity or use condoms correctly.  Do not douche.  Avoid alcohol as directed by your health care provider.  Avoid breastfeeding as directed by your health care provider. SEEK MEDICAL CARE IF:   Your symptoms are not improving after 3 days of treatment.  You have increased discharge or pain.  You have a fever. MAKE SURE YOU:   Understand these instructions.  Will watch your condition.  Will get help right away if you are not doing well or get worse. FOR MORE INFORMATION  Centers for Disease Control and Prevention, Division of STD Prevention: SolutionApps.co.za American Sexual Health Association (ASHA): www.ashastd.org    This information is not intended to replace advice given to you by your health care provider. Make sure you discuss any questions you have with your health care provider.   Document Released: 11/18/2005 Document Revised: 12/09/2014 Document Reviewed: 06/30/2013 Elsevier Interactive Patient Education [employer]

## 2016-01-30 LAB — GC/CHLAMYDIA PROBE AMP (~~LOC~~) NOT AT ARMC
CHLAMYDIA, DNA PROBE: NEGATIVE
NEISSERIA GONORRHEA: NEGATIVE

## 2016-01-30 LAB — RPR, QUANT+TP ABS (REFLEX)
Rapid Plasma Reagin, Quant: 1:1 {titer} — ABNORMAL HIGH
TREPONEMA PALLIDUM AB: POSITIVE — AB

## 2016-01-30 LAB — RPR: RPR Ser Ql: REACTIVE — AB

## 2016-01-31 ENCOUNTER — Telehealth: Payer: Self-pay | Admitting: Student

## 2016-01-31 NOTE — Telephone Encounter (Signed)
Called to inform pt of RPR results. No answer. Left voicemail.

## 2016-05-27 ENCOUNTER — Emergency Department (HOSPITAL_COMMUNITY)
Admission: EM | Admit: 2016-05-27 | Discharge: 2016-05-27 | Disposition: A | Discharge status: Home / Self Care | Payer: 59 | Attending: Emergency Medicine | Admitting: Emergency Medicine

## 2016-05-27 ENCOUNTER — Encounter (HOSPITAL_COMMUNITY): Payer: Self-pay | Admitting: *Deleted

## 2016-05-27 ENCOUNTER — Encounter (HOSPITAL_COMMUNITY): Payer: Self-pay | Admitting: Emergency Medicine

## 2016-05-27 DIAGNOSIS — Z79899 Other long term (current) drug therapy: Secondary | ICD-10-CM | POA: Insufficient documentation

## 2016-05-27 DIAGNOSIS — I10 Essential (primary) hypertension: Secondary | ICD-10-CM | POA: Insufficient documentation

## 2016-05-27 DIAGNOSIS — F17210 Nicotine dependence, cigarettes, uncomplicated: Secondary | ICD-10-CM | POA: Insufficient documentation

## 2016-05-27 DIAGNOSIS — F1910 Other psychoactive substance abuse, uncomplicated: Secondary | ICD-10-CM | POA: Insufficient documentation

## 2016-05-27 DIAGNOSIS — F1420 Cocaine dependence, uncomplicated: Secondary | ICD-10-CM | POA: Insufficient documentation

## 2016-05-27 DIAGNOSIS — Z046 Encounter for general psychiatric examination, requested by authority: Secondary | ICD-10-CM | POA: Insufficient documentation

## 2016-05-27 DIAGNOSIS — F1410 Cocaine abuse, uncomplicated: Secondary | ICD-10-CM

## 2016-05-27 DIAGNOSIS — Z008 Encounter for other general examination: Secondary | ICD-10-CM

## 2016-05-27 LAB — COMPREHENSIVE METABOLIC PANEL
ALT: 15 U/L (ref 14–54)
AST: 18 U/L (ref 15–41)
Albumin: 4.1 g/dL (ref 3.5–5.0)
Alkaline Phosphatase: 71 U/L (ref 38–126)
Anion gap: 7 (ref 5–15)
BUN: 13 mg/dL (ref 6–20)
CO2: 26 mmol/L (ref 22–32)
Calcium: 9.3 mg/dL (ref 8.9–10.3)
Chloride: 102 mmol/L (ref 101–111)
Creatinine, Ser: 0.83 mg/dL (ref 0.44–1.00)
GFR calc Af Amer: >60 mL/min (ref >60)
GFR calc non Af Amer: >60 mL/min (ref >60)
Glucose, Bld: 82 mg/dL (ref 65–99)
Potassium: 3.8 mmol/L (ref 3.5–5.1)
Sodium: 135 mmol/L (ref 135–145)
Total Bilirubin: 0.6 mg/dL (ref 0.3–1.2)
Total Protein: 7.9 g/dL (ref 6.5–8.1)

## 2016-05-27 LAB — RAPID URINE DRUG SCREEN, HOSP PERFORMED
Amphetamines: NOT DETECTED
Barbiturates: NOT DETECTED
Benzodiazepines: NOT DETECTED
Cocaine: POSITIVE — AB
Opiates: NOT DETECTED
Tetrahydrocannabinol: NOT DETECTED

## 2016-05-27 LAB — CBC
HCT: 38.6 % (ref 36.0–46.0)
Hemoglobin: 13.6 g/dL (ref 12.0–15.0)
MCH: 33.8 pg (ref 26.0–34.0)
MCHC: 35.2 g/dL (ref 30.0–36.0)
MCV: 96 fL (ref 78.0–100.0)
Platelets: 259 10*3/uL (ref 150–400)
RBC: 4.02 MIL/uL (ref 3.87–5.11)
RDW: 13.8 % (ref 11.5–15.5)
WBC: 10.2 10*3/uL (ref 4.0–10.5)

## 2016-05-27 LAB — ACETAMINOPHEN LEVEL: Acetaminophen (Tylenol), Serum: <10 ug/mL — ABNORMAL LOW (ref 10–30)

## 2016-05-27 LAB — ETHANOL: Alcohol, Ethyl (B): <5 mg/dL (ref <5)

## 2016-05-27 LAB — I-STAT BETA HCG BLOOD, ED (MC, WL, AP ONLY): I-stat hCG, quantitative: <5 m[IU]/mL (ref <5)

## 2016-05-27 LAB — SALICYLATE LEVEL: Salicylate Lvl: <4 mg/dL (ref 2.8–30.0)

## 2016-05-27 MED ORDER — FLUCONAZOLE 200 MG PO TABS
200.0000 mg | ORAL_TABLET | Freq: Once | ORAL | Status: AC
  Administered 2016-05-27: 200 mg via ORAL
  Filled 2016-05-27: qty 1

## 2016-05-27 NOTE — ED Notes (Signed)
Patient was alert, oriented and stable upon discharge. RN went over AVS and patient had no further questions.  

## 2016-05-27 NOTE — ED Provider Notes (Signed)
CSN: [id number]     Arrival date & time 05/27/16  1954 History   First MD Initiated Contact with Patient 05/27/16 2006     Chief Complaint  Patient presents with  . Medical Clearance     (Consider location/radiation/quality/duration/timing/severity/associated sxs/prior Treatment) HPI Comments: 40 year old female history of hypertension, syphilis, and hepatitis C presents to the emergency department after discharge requesting medical clearance. 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 She has been using drugs in conjunction with alcohol. Patient denies suicidal and homicidal thoughts. Following discharge, she called [facility] from the lobby who advised medical clearance in order for them to accept her. She plans to go to [facility] this evening after completion of her lab work. No other complaints other than mild agitation secondary to feeling as though she was "pushed out the door" when previously seen.  The history is provided by the patient and medical records. No language interpreter was used.    Past Medical History  Diagnosis Date  . Hep C w/o coma, chronic (HCC)   . Hypertension   . Tuberculosis of lung, nodular 1999  . Syphilis    Past Surgical History  Procedure Laterality Date  . Appendectomy    . Tubal ligation    . Examination under anesthesia  12/15/2011    Procedure: EXAM UNDER ANESTHESIA;  Surgeon: Rulon AbideBrian David Layton, DO;  Location: [facility];  Service: General;  Laterality: N/A;  incison and drainage of peri rectal abcess  . Rectal surgery     No family history on file. Social History  Substance Use Topics  . Smoking status: Current Some Day Smoker -- 27 years    Types: Cigarettes  . Smokeless tobacco: Never Used  . Alcohol Use: 0.0 oz/week    0 Standard drinks or equivalent per week     Comment: binge drinking   OB History    Gravida Para Term Preterm AB TAB SAB Ectopic Multiple Living   3 3 3              Review of Systems   Psychiatric/Behavioral: Positive for behavioral problems (substance abuse).  Ten systems reviewed and are negative for acute change, except as noted in the HPI.    Allergies  Tylenol  Home Medications   Prior to Admission medications   Medication Sig Start Date End Date Taking? Authorizing Provider  FLUoxetine (PROZAC) 20 MG capsule Take 1 capsule (20 mg total) by mouth daily. Patient not taking: Reported on 05/27/2016 01/16/16   Earney NavyJosephine C Onuoha, NP  hydrochlorothiazide (HYDRODIURIL) 25 MG tablet Take 1 tablet (25 mg total) by mouth daily. 01/29/16   Marlis EdelsonWalidah N Karim, CNM  metroNIDAZOLE (FLAGYL) 500 MG tablet Take 1 tablet (500 mg total) by mouth 2 (two) times daily. Patient not taking: Reported on 05/27/2016 01/29/16   Marlis EdelsonWalidah N Karim, CNM  nicotine (NICODERM CQ - DOSED IN MG/24 HOURS) 21 mg/24hr patch Place 1 patch (21 mg total) onto the skin daily. Patient not taking: Reported on 05/27/2016 01/16/16   Earney NavyJosephine C Onuoha, NP   BP 136/104 mmHg  Pulse 50  Temp(Src) 98.1 F (36.7 C) (Oral)  Resp 16  SpO2 100%  LMP 05/26/2016   Physical Exam  Constitutional: She is oriented to person, place, and time. She appears well-developed and well-nourished. No distress.  HENT:  Head: Normocephalic and atraumatic.  Eyes: Conjunctivae and EOM are normal. No scleral icterus.  Neck: Normal range of motion.  Pulmonary/Chest: Effort normal. No respiratory distress.  Musculoskeletal: Normal range of motion.  Neurological: She is alert and oriented to person, place, and time. She exhibits normal muscle tone. Coordination normal.  Skin: Skin is warm and dry. No rash noted. She is not diaphoretic. No erythema. No pallor.  Psychiatric: She has a normal mood and affect. Her speech is normal. She is agitated (mild).  Nursing note and vitals reviewed.   ED Course  Procedures (including critical care time) Labs Review Labs Reviewed  ACETAMINOPHEN LEVEL - Abnormal; Notable for the following:     Acetaminophen (Tylenol), Serum <10 (*)    All other components within normal limits  URINE RAPID DRUG SCREEN, HOSP PERFORMED - Abnormal; Notable for the following:    Cocaine POSITIVE (*)    All other components within normal limits  COMPREHENSIVE METABOLIC PANEL  ETHANOL  SALICYLATE LEVEL  CBC  I-STAT BETA HCG BLOOD, ED ([facility], [facility], [facility] ONLY)    Imaging Review No results found.   I have personally reviewed and evaluated these images and lab results as part of my medical decision-making.   EKG Interpretation None      MDM   Final diagnoses:  Medical clearance for psychiatric admission    Patient medically cleared. She is stable for discharge so that she may go to [facility].   Filed Vitals:   05/27/16 2000  BP: 136/104  Pulse: 50  Temp: 98.1 F (36.7 C)  TempSrc: Oral  Resp: 16  SpO2: 100%     Antony MaduraKelly Ellie Spickler, PA-C 05/27/16 2221  Raeford RazorStephen Kohut, MD 05/28/16 1036

## 2016-05-27 NOTE — ED Provider Notes (Signed)
CSN: [id number]     Arrival date & time 05/27/16  1524 History   First MD Initiated Contact with Patient 05/27/16 1848     Chief Complaint  Patient presents with  . Detox      (Consider location/radiation/quality/duration/timing/severity/associated sxs/prior Treatment) HPI    40 year old female polysubstance abuse. She is previously sober for 4 years. She relapsed on cocaine this past fall. She is presenting today requesting detox. She is requesting an inpatient facility. She feels like she is back completely removed from her current living environment and she will continue to use. She denies any suicidal or homicidal ideations. No hallucinations. Currently, no somatic complaints.  Past Medical History  Diagnosis Date  . Hep C w/o coma, chronic (HCC)   . Hypertension   . Tuberculosis of lung, nodular 1999  . Syphilis    Past Surgical History  Procedure Laterality Date  . Appendectomy    . Tubal ligation    . Examination under anesthesia  12/15/2011    Procedure: EXAM UNDER ANESTHESIA;  Surgeon: Rulon AbideBrian David Layton, DO;  Location: [facility];  Service: General;  Laterality: N/A;  incison and drainage of peri rectal abcess  . Rectal surgery     No family history on file. Social History  Substance Use Topics  . Smoking status: Current Some Day Smoker -- 27 years    Types: Cigarettes  . Smokeless tobacco: Never Used  . Alcohol Use: 0.0 oz/week    0 Standard drinks or equivalent per week     Comment: binge drinking   OB History    Gravida Para Term Preterm AB TAB SAB Ectopic Multiple Living   3 3 3             Review of Systems  All systems reviewed and negative, other than as noted in HPI.   Allergies  Tylenol  Home Medications   Prior to Admission medications   Medication Sig Start Date End Date Taking? Authorizing Provider  hydrochlorothiazide (HYDRODIURIL) 25 MG tablet Take 1 tablet (25 mg total) by mouth daily. 01/29/16  Yes Marlis EdelsonWalidah N Karim, CNM  FLUoxetine (PROZAC)  20 MG capsule Take 1 capsule (20 mg total) by mouth daily. Patient not taking: Reported on 05/27/2016 01/16/16   Earney NavyJosephine C Onuoha, NP  metroNIDAZOLE (FLAGYL) 500 MG tablet Take 1 tablet (500 mg total) by mouth 2 (two) times daily. Patient not taking: Reported on 05/27/2016 01/29/16   Marlis EdelsonWalidah N Karim, CNM  nicotine (NICODERM CQ - DOSED IN MG/24 HOURS) 21 mg/24hr patch Place 1 patch (21 mg total) onto the skin daily. Patient not taking: Reported on 05/27/2016 01/16/16   Earney NavyJosephine C Onuoha, NP   BP 139/87 mmHg  Pulse 66  Temp(Src) 98.1 F (36.7 C) (Oral)  Resp 18  SpO2 100%  LMP 05/26/2016 Physical Exam  Constitutional: She is oriented to person, place, and time. She appears well-developed and well-nourished. No distress.  HENT:  Head: Normocephalic and atraumatic.  Eyes: Conjunctivae are normal. Right eye exhibits no discharge. Left eye exhibits no discharge.  Neck: Neck supple.  Cardiovascular: Normal rate, regular rhythm and normal heart sounds.  Exam reveals no gallop and no friction rub.   No murmur heard. Pulmonary/Chest: Effort normal and breath sounds normal. No respiratory distress.  Abdominal: Soft. She exhibits no distension. There is no tenderness.  Musculoskeletal: She exhibits no edema or tenderness.  Neurological: She is alert and oriented to person, place, and time. No cranial nerve deficit. She exhibits normal muscle tone. Coordination normal.  Skin: Skin is warm and dry.  Psychiatric: She has a normal mood and affect. Her behavior is normal. Thought content normal.  Nursing note and vitals reviewed.   ED Course  Procedures (including critical care time) Labs Review Labs Reviewed - No data to display  Imaging Review No results found. I have personally reviewed and evaluated these images and lab results as part of my medical decision-making.   EKG Interpretation None      MDM   Final diagnoses:  Cocaine dependence without complication [facility](HCC)  Cocaine abuse     40 year old female polysubstance abuse. She denies suicidal or homicidal ideation. She is not psychotic. She will be provided with resource list. As I was leaving room, she also relayed that she felt she had a yeast infection. Itching/irritation. She has had previous yeast infections with the same symptoms.   Raeford RazorStephen Angeli Demilio, MD 05/27/16 2015

## 2016-05-27 NOTE — Discharge Instructions (Signed)
Polysubstance Abuse °When people abuse more than one drug or type of drug it is called polysubstance or polydrug abuse. For example, many smokers also drink alcohol. This is one form of polydrug abuse. Polydrug abuse also refers to the use of a drug to counteract an unpleasant effect produced by another drug. It may also be used to help with withdrawal from another drug. People who take stimulants may become agitated. Sometimes this agitation is countered with a tranquilizer. This helps protect against the unpleasant side effects. Polydrug abuse also refers to the use of different drugs at the same time.  °Anytime drug use is interfering with normal living activities, it has become abuse. This includes problems with family and friends. Psychological dependence has developed when your mind tells you that the drug is needed. This is usually followed by physical dependence which has developed when continuing increases of drug are required to get the same feeling or "high". This is known as addiction or chemical dependency. A person's risk is much higher if there is a history of chemical dependency in the family. °SIGNS OF CHEMICAL DEPENDENCY °· You have been told by friends or family that drugs have become a problem. °· You fight when using drugs. °· You are having blackouts (not remembering what you do while using). °· You feel sick from using drugs but continue using. °· You lie about use or amounts of drugs (chemicals) used. °· You need chemicals to get you going. °· You are suffering in work performance or in school because of drug use. °· You get sick from use of drugs but continue to use anyway. °· You need drugs to relate to people or feel comfortable in social situations. °· You use drugs to forget problems. °"Yes" answered to any of the above signs of chemical dependency indicates there are problems. The longer the use of drugs continues, the greater the problems will become. °If there is a family history of  drug or alcohol use, it is best not to experiment with these drugs. Continual use leads to tolerance. After tolerance develops more of the drug is needed to get the same feeling. This is followed by addiction. With addiction, drugs become the most important part of life. It becomes more important to take drugs than participate in the other usual activities of life. This includes relating to friends and family. Addiction is followed by dependency. Dependency is a condition where drugs are now needed not just to get high, but to feel normal. °Addiction cannot be cured but it can be stopped. This often requires outside help and the care of professionals. Treatment centers are listed in the yellow pages under: Cocaine, Narcotics, and Alcoholics Anonymous. Most hospitals and clinics can refer you to a specialized care center. Talk to your caregiver if you need help. °  °This information is not intended to replace advice given to you by your health care provider. Make sure you discuss any questions you have with your health care provider. °  °Document Released: 07/10/2005 Document Revised: 02/10/2012 Document Reviewed: 11/23/2014 °Elsevier Interactive Patient Education ©2016 Elsevier Inc. ° °

## 2016-05-27 NOTE — ED Notes (Signed)
Pt was just discharged from triage; pt called [facility] from the lobby and they advised that they wanted labs to confirm that pt was medically clear; pt checked in to get lab work to try to get accepted to [facility]

## 2016-05-27 NOTE — Discharge Instructions (Signed)
Chemical Dependency Chemical dependency is an addiction to drugs or alcohol. It is characterized by the repeated behavior of seeking out and using drugs and alcohol despite harmful consequences to the health and safety of ones self and others.  RISK FACTORS There are certain situations or behaviors that increase a person's risk for chemical dependency. These include:  A family history of chemical dependency.  A history of mental health issues, including depression and anxiety.  A home environment where drugs and alcohol are easily available to you.  Drug or alcohol use at a young age. SYMPTOMS  The following symptoms can indicate chemical dependency:  Inability to limit the use of drugs or alcohol.  Nausea, sweating, shakiness, and anxiety that occurs when alcohol or drugs are not being used.  An increase in amount of drugs or alcohol that is necessary to get drunk or high. People who experience these symptoms can assess their use of drugs and alcohol by asking themselves the following questions:  Have you been told by friends or family that they are worried about your use of alcohol or drugs?  Do friends and family ever tell you about things you did while drinking alcohol or using drugs that you do not remember?  Do you lie about using alcohol or drugs or about the amounts you use?  Do you have difficulty completing daily tasks unless you use alcohol or drugs?  Is the level of your work or school performance lower because of your drug or alcohol use?  Do you get sick from using drugs or alcohol but keep using anyway?  Do you feel uncomfortable in social situations unless you use alcohol or drugs?  Do you use drugs or alcohol to help forget problems? An answer of yes to any of these questions may indicate chemical dependency. Professional evaluation is suggested.   This information is not intended to replace advice given to you by your health care provider. Make sure you  discuss any questions you have with your health care provider.   Document Released: 11/12/2001 Document Revised: 02/10/2012 Document Reviewed: 01/24/2011 Elsevier Interactive Patient Education 2016 [employer]. Substance Abuse Treatment Programs  Intensive Outpatient Programs [facility]     [address]      [place], [place]                   [phone]       [facility] [address] [place] [address] [place], [place] [phone]  [facility]     (Inpatient and outpatient)     [address]           [phone]    [facility] [phone] (Suboxone and Methadone)  [address]      [place], [place] [address]      [phone]       [address] [place], [place] [phone]  [facility] (Outpatient/Inpatient, Chemical)    (insurance only) [phone]             [facility] (Groups & Residential) [place], [place] [phone]     [facility]     [address]     [place], [place]      [phone]       [facility] (for caregivers and family) [phone] [address] [place], [place] [phone]      Residential Treatment Programs [facility]      [address], [place], [place] [address]  [phone]       [facility] [address] [place], [place] [address] [phone]  [facility]        [phone]       [facility] [phone]  [facility]             [address]                                         [place], [place]                                                [phone] or [phone]                               [facility] [place] [address] [place], [address] [phone]  [facility]    [address]      [place], [place]     [phone]       [facility] [address] [place],  [place] [phone]  [facility]   [address]     [place], [place] [address]     [phone]      Admissions: 8am-3pm M-F  [facility] ([facility]) [address] [place], [place] [phone]  [facility]: Residential Program [phone](90 Days)   [place], [place]      [phone] or [phone]     [facility]ATC: [facility] [place], [place] (Walk in Hours over the weekend or by referral)  [facility] [address] [place], [place], [place] [address] [phone]  Crisis Mobile: [facility]:  [phone] (for crisis response 24 hours a day) [facility] Hotline:      [phone] Outpatient Psychiatry and Counseling  [facility]: Mobile Crisis Management 24 hours:  [phone]  [facility] sliding scale fee and walk in schedule: M-F 8am-12pm/1pm-3pm [address]  [place], [place] [address] [phone]  [facility] [address] [place], [place] [address] [phone]  [facility] ([facility])- new patient walk-in appointments available Monday - Friday 8am -3pm.          [address] [place], [place] [address] [phone] or crisis line- [phone]  [facility]/ Intensive Outpatient Therapy Program [address] [place], [place] [address] [phone]  [facility]                  Crisis Services      [phone]      [address]     [place], [place] [address]                 [place] [facility]   [facility] [phone] [address] [place], [place] [address]   [facility]          [address] [place],  [place], [place] [address]       [phone]  [facility] [address] [place], [place] [address] [phone]  [facility]  [address]    [place], [place] [address]     [phone]       Andee PolesParish McKinney, MD     [address]     [place] [address]     [phone]       [facility] [address] [place] [address]  [facility]     [address]     [place], [place]      [phone]       [facility] Eulogio DitchShamsher Ahluwalia, MD [address] [place], [place] [address] [phone]  [facility]     [address]     [place], [place] [address]     [phone]       [facility] [address] [place], [place] [address] [phone] Resources for Temporary Residential Assistance/Crisis Centers  [facility](IRC) M-F 8am-3pm   [address] [place], [place] [address]   [phone] Services include: laundry, barbering, support groups, case management, phone  & computer access, showers, AA/NA mtgs, mental health/substance abuse nurse, job skills class, disability information, VA assistance, spiritual classes, etc.   HOMELESS SHELTERS  [facility]     [facility]   [address], [place]     [phone]              [facility] (women and children)       [address] [place], [place] [address] [phone] [email or web address] for application and process Application Required  [facility]   [address]    [place] [address]     [phone]                    [facility] [address] [place], [place] [address] [phone](schedule application appt.) Application Required  [facility] (women only)    [address]     [place], [place] [address]     [phone]      Intake starts 6pm daily Need valid ID, SSC, & Police report [facility] [place] [address] [place], [place] [phone] Application  Required  [facility] (men only)     [address]      [place], [place]     [phone]       [facility] (Pregnant women only) [address] [place], [place] [phone]  [facility]      [address]      [place], [place] [address]     [phone]             [facility] [place] [facility] [address] [place], [place] [phone] 90 day commitment/SA/Application process  [facility](men only)     [address]     [place], [place]     [phone]       Check-in at [facility]            Crisis [facility] [address] [place], [place] [address] [phone] Men/Women/Women and Children must be there by 7 pm  [facility] [place], [place] [phone]

## 2016-05-27 NOTE — ED Notes (Signed)
Pt sts "I'm on drugs and I need help getting off of them." Pt sts she is addicted to cocaine and marijuana. Last use was this morning. Pt also sts she has been drinking lots of alcohol. Pt's last drink of alcohol was 2am this morning. Pt denies SI/HI. Denies chest pain, SOB. A&Ox4 and ambulatory.

## 2016-05-27 NOTE — ED Notes (Signed)
Results called to [facility]

## 2016-06-11 ENCOUNTER — Emergency Department (HOSPITAL_COMMUNITY)
Admission: EM | Admit: 2016-06-11 | Discharge: 2016-06-12 | Disposition: A | Discharge status: Other Acute Inpatient Hospital | Payer: 59 | Attending: Emergency Medicine | Admitting: Emergency Medicine

## 2016-06-11 ENCOUNTER — Encounter (HOSPITAL_COMMUNITY): Payer: Self-pay | Admitting: *Deleted

## 2016-06-11 DIAGNOSIS — R45851 Suicidal ideations: Secondary | ICD-10-CM | POA: Insufficient documentation

## 2016-06-11 DIAGNOSIS — F1410 Cocaine abuse, uncomplicated: Secondary | ICD-10-CM

## 2016-06-11 DIAGNOSIS — F1010 Alcohol abuse, uncomplicated: Secondary | ICD-10-CM | POA: Insufficient documentation

## 2016-06-11 DIAGNOSIS — I10 Essential (primary) hypertension: Secondary | ICD-10-CM | POA: Insufficient documentation

## 2016-06-11 DIAGNOSIS — Z79899 Other long term (current) drug therapy: Secondary | ICD-10-CM | POA: Insufficient documentation

## 2016-06-11 DIAGNOSIS — F17210 Nicotine dependence, cigarettes, uncomplicated: Secondary | ICD-10-CM | POA: Insufficient documentation

## 2016-06-11 LAB — CBC
HEMATOCRIT: 37 % (ref 36.0–46.0)
Hemoglobin: 12.2 g/dL (ref 12.0–15.0)
MCH: 32.3 pg (ref 26.0–34.0)
MCHC: 33 g/dL (ref 30.0–36.0)
MCV: 97.9 fL (ref 78.0–100.0)
Platelets: 289 10*3/uL (ref 150–400)
RBC: 3.78 MIL/uL — AB (ref 3.87–5.11)
RDW: 14 % (ref 11.5–15.5)
WBC: 9.3 10*3/uL (ref 4.0–10.5)

## 2016-06-11 LAB — I-STAT BETA HCG BLOOD, ED (MC, WL, AP ONLY): I-stat hCG, quantitative: <5 m[IU]/mL (ref <5)

## 2016-06-11 LAB — COMPREHENSIVE METABOLIC PANEL
ALK PHOS: 81 U/L (ref 38–126)
ALT: 16 U/L (ref 14–54)
AST: 19 U/L (ref 15–41)
Albumin: 3.7 g/dL (ref 3.5–5.0)
Anion gap: 10 (ref 5–15)
BILIRUBIN TOTAL: 0.7 mg/dL (ref 0.3–1.2)
BUN: 9 mg/dL (ref 6–20)
CALCIUM: 9 mg/dL (ref 8.9–10.3)
CHLORIDE: 107 mmol/L (ref 101–111)
CO2: 21 mmol/L — ABNORMAL LOW (ref 22–32)
Creatinine, Ser: 0.91 mg/dL (ref 0.44–1.00)
Glucose, Bld: 97 mg/dL (ref 65–99)
Potassium: 3.1 mmol/L — ABNORMAL LOW (ref 3.5–5.1)
Sodium: 138 mmol/L (ref 135–145)
TOTAL PROTEIN: 7.3 g/dL (ref 6.5–8.1)

## 2016-06-11 LAB — RAPID URINE DRUG SCREEN, HOSP PERFORMED
Amphetamines: NOT DETECTED
BARBITURATES: NOT DETECTED
BENZODIAZEPINES: NOT DETECTED
COCAINE: POSITIVE — AB
Opiates: NOT DETECTED
TETRAHYDROCANNABINOL: NOT DETECTED

## 2016-06-11 MED ORDER — ONDANSETRON HCL 4 MG PO TABS: 4.0000 mg | ORAL_TABLET | Freq: Three times a day (TID) | ORAL | Status: DC | PRN

## 2016-06-11 MED ORDER — LORAZEPAM 1 MG PO TABS: 0.0000 mg | ORAL_TABLET | Freq: Four times a day (QID) | ORAL | Status: DC

## 2016-06-11 MED ORDER — POTASSIUM CHLORIDE CRYS ER 20 MEQ PO TBCR
40.0000 meq | EXTENDED_RELEASE_TABLET | Freq: Once | ORAL | Status: AC
  Administered 2016-06-11: 40 meq via ORAL
  Filled 2016-06-11: qty 2

## 2016-06-11 MED ORDER — LORAZEPAM 1 MG PO TABS: 0.0000 mg | ORAL_TABLET | Freq: Two times a day (BID) | ORAL | Status: DC

## 2016-06-11 NOTE — ED Notes (Signed)
Pt states she has a drug and alcohol problem and wants help. Pt used cocaine and marijuana this morning, last drink 20 minutes prior to arrival. Pt drink about a pint of liquor a day. Pt would like to be placed in a long-term treatment center. Pt endorses thoughts of hurting herself. States she doesn't want to hurt herself but if she doesn't get any help she will hurt herself. Pt denies HI. Pt denies auditory/visual hallucinations.

## 2016-06-11 NOTE — ED Provider Notes (Signed)
CSN: [id number]     Arrival date & time 06/11/16  1702 History   First MD Initiated Contact with Patient 06/11/16 2154     Chief Complaint  Patient presents with  . Alcohol Problem  . Drug Problem     (Consider location/radiation/quality/duration/timing/severity/associated sxs/prior Treatment) HPI Patient states she was just released from rehabilitation on Saturday. She is upset because she thought that she was supposed to stay until Monday. She began to immediately use cocaine. She states she uses 2-3 g daily since Saturday. She is also drinking alcohol. She is unclear on the amount of alcohol she is drinking daily. She states she did have some wine and beer before coming into the emergency department. She denies any specific auditory or visual hallucinations. She states she occasionally has lights flashing before her eyes. She has vague suicidal ideation. She states if she doesn't get help she thinks she will hurt herself. No homicidal ideation. Denies nausea or vomiting. Denies any other ingestion. Past Medical History  Diagnosis Date  . Hep C w/o coma, chronic (HCC)   . Hypertension   . Tuberculosis of lung, nodular 1999  . Syphilis    Past Surgical History  Procedure Laterality Date  . Appendectomy    . Tubal ligation    . Examination under anesthesia  12/15/2011    Procedure: EXAM UNDER ANESTHESIA;  Surgeon: Rulon Abide, DO;  Location: [facility];  Service: General;  Laterality: N/A;  incison and drainage of peri rectal abcess  . Rectal surgery     History reviewed. No pertinent family history. Social History  Substance Use Topics  . Smoking status: Current Some Day Smoker -- 27 years    Types: Cigarettes  . Smokeless tobacco: Never Used  . Alcohol Use: 0.0 oz/week    0 Standard drinks or equivalent per week     Comment: binge drinking   OB History    Gravida Para Term Preterm AB TAB SAB Ectopic Multiple Living   Review of Systems  Constitutional:  Negative for fever and chills.  Respiratory: Negative for chest tightness and shortness of breath.   Cardiovascular: Negative for chest pain.  Gastrointestinal: Negative for nausea, vomiting, abdominal pain and diarrhea.  Musculoskeletal: Negative for myalgias, back pain, neck pain and neck stiffness.  Skin: Negative for rash and wound.  Neurological: Negative for dizziness, syncope, weakness, numbness and headaches.  Psychiatric/Behavioral: Positive for suicidal ideas.  All other systems reviewed and are negative.     Allergies  Tylenol  Home Medications   Prior to Admission medications   Medication Sig Start Date End Date Taking? Authorizing Provider  hydrochlorothiazide (HYDRODIURIL) 25 MG tablet Take 1 tablet (25 mg total) by mouth daily. 01/29/16  Yes Eino Farber Kennith Gain, CNM   BP 135/81 mmHg  Pulse 79  Temp(Src) 98.1 F (36.7 C) (Oral)  Resp 18  SpO2 98%  LMP 05/26/2016 Physical Exam  Constitutional: She is oriented to person, place, and time. She appears well-developed and well-nourished. No distress.  HENT:  Head: Normocephalic and atraumatic.  Mouth/Throat: Oropharynx is clear and moist. No oropharyngeal exudate.  Eyes: EOM are normal. Pupils are equal, round, and reactive to light.  Neck: Normal range of motion. Neck supple.  Cardiovascular: Normal rate and regular rhythm.  Exam reveals no gallop and no friction rub.   No murmur heard. Pulmonary/Chest: Effort normal and breath sounds normal. No respiratory distress. She has no  wheezes. She has no rales.  Abdominal: Soft. Bowel sounds are normal. She exhibits no distension and no mass. There is no tenderness. There is no rebound and no guarding.  Musculoskeletal: Normal range of motion. She exhibits no edema or tenderness.  No lower extremity swelling or asymmetry.  Neurological: She is alert and oriented to person, place, and time.  Moves all extremities without deficit. Sensation is fully intact.  Skin: Skin is warm  and dry. No rash noted. No erythema.  Psychiatric: She has a normal mood and affect. Her behavior is normal.  Admits to SI but without specific plan. No HI. No auditory or visual hallucinations. Not responding to internal stimuli.  Nursing note and vitals reviewed.   ED Course  Procedures (including critical care time) Labs Review Labs Reviewed  COMPREHENSIVE METABOLIC PANEL - Abnormal; Notable for the following:    Potassium 3.1 (*)    CO2 21 (*)    All other components within normal limits  CBC - Abnormal; Notable for the following:    RBC 3.78 (*)    All other components within normal limits  URINE RAPID DRUG SCREEN, HOSP PERFORMED - Abnormal; Notable for the following:    Cocaine POSITIVE (*)    All other components within normal limits  ETHANOL  SALICYLATE LEVEL  ACETAMINOPHEN LEVEL  I-STAT BETA HCG BLOOD, ED ([facility], [facility], [facility] ONLY)    Imaging Review No results found. I have personally reviewed and evaluated these images and lab results as part of my medical decision-making.   EKG Interpretation None      MDM   Final diagnoses:  Suicidal ideation  Cocaine abuse    Cleared medically and will have TTS eval. Potassium replaced in ED.     Loren Raceravid Kamryn Messineo, MD 06/11/16 [phone]

## 2016-06-12 ENCOUNTER — Observation Stay (HOSPITAL_COMMUNITY)
Admission: AD | Admit: 2016-06-12 | Discharge: 2016-06-12 | Disposition: A | Discharge status: Home / Self Care | Payer: Self-pay | Source: Intra-hospital | Attending: Psychiatry | Admitting: Psychiatry

## 2016-06-12 ENCOUNTER — Encounter (HOSPITAL_COMMUNITY): Payer: Self-pay | Admitting: *Deleted

## 2016-06-12 DIAGNOSIS — B182 Chronic viral hepatitis C: Secondary | ICD-10-CM | POA: Insufficient documentation

## 2016-06-12 DIAGNOSIS — Z8611 Personal history of tuberculosis: Secondary | ICD-10-CM | POA: Insufficient documentation

## 2016-06-12 DIAGNOSIS — F1420 Cocaine dependence, uncomplicated: Secondary | ICD-10-CM | POA: Insufficient documentation

## 2016-06-12 DIAGNOSIS — F1290 Cannabis use, unspecified, uncomplicated: Secondary | ICD-10-CM | POA: Insufficient documentation

## 2016-06-12 DIAGNOSIS — I10 Essential (primary) hypertension: Secondary | ICD-10-CM | POA: Insufficient documentation

## 2016-06-12 DIAGNOSIS — F17210 Nicotine dependence, cigarettes, uncomplicated: Secondary | ICD-10-CM | POA: Insufficient documentation

## 2016-06-12 DIAGNOSIS — F332 Major depressive disorder, recurrent severe without psychotic features: Principal | ICD-10-CM

## 2016-06-12 DIAGNOSIS — Z886 Allergy status to analgesic agent status: Secondary | ICD-10-CM | POA: Insufficient documentation

## 2016-06-12 DIAGNOSIS — Z5321 Procedure and treatment not carried out due to patient leaving prior to being seen by health care provider: Secondary | ICD-10-CM | POA: Insufficient documentation

## 2016-06-12 LAB — ACETAMINOPHEN LEVEL

## 2016-06-12 LAB — ETHANOL

## 2016-06-12 LAB — SALICYLATE LEVEL

## 2016-06-12 MED ORDER — IBUPROFEN 400 MG PO TABS
400.0000 mg | ORAL_TABLET | Freq: Four times a day (QID) | ORAL | Status: DC | PRN
  Administered 2016-06-12: 400 mg via ORAL
  Filled 2016-06-12 (×2): qty 1

## 2016-06-12 MED ORDER — NICOTINE 14 MG/24HR TD PT24
14.0000 mg | MEDICATED_PATCH | Freq: Every day | TRANSDERMAL | Status: DC
  Administered 2016-06-12: 14 mg via TRANSDERMAL
  Filled 2016-06-12: qty 1

## 2016-06-12 MED ORDER — HYDROXYZINE HCL 25 MG PO TABS: 25.0000 mg | ORAL_TABLET | Freq: Four times a day (QID) | ORAL | Status: DC | PRN

## 2016-06-12 MED ORDER — HYDROCHLOROTHIAZIDE 25 MG PO TABS: 25.0000 mg | ORAL_TABLET | Freq: Every day | ORAL | Status: DC

## 2016-06-12 MED ORDER — ALUM & MAG HYDROXIDE-SIMETH 200-200-20 MG/5ML PO SUSP: 30.0000 mL | ORAL | Status: DC | PRN

## 2016-06-12 MED ORDER — TRAZODONE HCL 50 MG PO TABS: 50.0000 mg | ORAL_TABLET | Freq: Every evening | ORAL | Status: DC | PRN

## 2016-06-12 MED ORDER — MAGNESIUM HYDROXIDE 400 MG/5ML PO SUSP: 30.0000 mL | Freq: Every day | ORAL | Status: DC | PRN

## 2016-06-12 MED ORDER — HYDROCHLOROTHIAZIDE 25 MG PO TABS
25.0000 mg | ORAL_TABLET | Freq: Every day | ORAL | Status: DC
  Administered 2016-06-12: 25 mg via ORAL
  Filled 2016-06-12: qty 1

## 2016-06-12 MED ORDER — ONDANSETRON HCL 4 MG PO TABS: 4.0000 mg | ORAL_TABLET | Freq: Three times a day (TID) | ORAL | Status: DC | PRN

## 2016-06-12 NOTE — ED Notes (Signed)
TTS started, pt given sprite upon request
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
## 2016-06-12 NOTE — ED Notes (Signed)
Awaiting [place] 

## 2016-06-12 NOTE — [facility] Counselor (Signed)
This Clinical research associatewriter faxed supporting documentation to [facility] for patient placement. Patient signed a ROI form providing [facility] with consent.   Ardelle ParkLatoya McNeil, MA OBS Unit

## 2016-06-12 NOTE — Progress Notes (Signed)
Pt escorted to lobby.  Pt waiting outside on bench for her friend to pick her up.

## 2016-06-12 NOTE — ED Notes (Signed)
Patient was given a cup of orange juice.  

## 2016-06-12 NOTE — ED Notes (Signed)
Ordered pt breakfast.
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
## 2016-06-12 NOTE — BHH Counselor (Signed)
This Probation officer met with pt upon admission to start the disposition process. Pt shared that she was recently released from [facility] on Saturday, and she immediately relapsed on cocaine and alcohol. Pt also shared that she has an upcoming appointment at [facility] in [facility] on Wednesday, June 19, 2016 @ 8am. This Probation officer made telephone contact with Arbie Cookey at [facility] to follow up. This Probation officer was informed that the above mentioned pt does have an appointment on next Wednesday @ 8am and she should expect to come prepared to stay for residential treatment. This Probation officer provided pt with a list of OPT resources and homeless shelters in the surrounding area. Pt reports that she is at her breaking point in her life and she wants to create a better life for her and her children. Pt has started making calls to OPT resources.  Redmond Pulling, MA OBS Counselor

## 2016-06-12 NOTE — ED Notes (Signed)
Ordered Lunch. 

## 2016-06-12 NOTE — Progress Notes (Signed)
Pt on phone at shift change talking to friend.  After conversation, Pt sts she is leaving AMA.  Pt sts [facility] and would prefer to wait for inpatient. Program from home.  Pt given AMA form and has signed it and this Clinical research associatewriter has witnessed signature.  Pt will receive property and will be escorted to lobby.

## 2016-06-13 NOTE — Discharge Summary (Signed)
[facility] OBS UNIT DISCHARGE SUMMARY  *Pt left AMA last night hours after we did the H&P* There was no opportunity for a discharge assessment. Pt was given outpatient resources. See nursing notes. No provider was present at time of discharge.   Beau FannyWithrow, Zerrick Hanssen C, [place]NP  06/13/2016 5:11 PM
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
## 2016-06-24 ENCOUNTER — Emergency Department (HOSPITAL_COMMUNITY)
Admission: EM | Admit: 2016-06-24 | Discharge: 2016-06-24 | Disposition: A | Discharge status: Home / Self Care | Payer: 59 | Attending: Dermatology | Admitting: Dermatology

## 2016-06-24 ENCOUNTER — Emergency Department (HOSPITAL_COMMUNITY): Payer: 59

## 2016-06-24 ENCOUNTER — Encounter (HOSPITAL_COMMUNITY): Payer: Self-pay | Admitting: Emergency Medicine

## 2016-06-24 DIAGNOSIS — F17210 Nicotine dependence, cigarettes, uncomplicated: Secondary | ICD-10-CM | POA: Insufficient documentation

## 2016-06-24 DIAGNOSIS — I10 Essential (primary) hypertension: Secondary | ICD-10-CM | POA: Insufficient documentation

## 2016-06-24 DIAGNOSIS — Z5321 Procedure and treatment not carried out due to patient leaving prior to being seen by health care provider: Secondary | ICD-10-CM | POA: Insufficient documentation

## 2016-06-24 DIAGNOSIS — Z79899 Other long term (current) drug therapy: Secondary | ICD-10-CM | POA: Insufficient documentation

## 2016-06-24 DIAGNOSIS — M25561 Pain in right knee: Secondary | ICD-10-CM | POA: Insufficient documentation

## 2016-06-24 NOTE — ED Notes (Signed)
No answer

## 2016-06-24 NOTE — ED Notes (Signed)
Patient called from lobby to triage. No answer from lobby

## 2016-06-24 NOTE — ED Triage Notes (Signed)
Pt c/o R knee swelling and pain x 2 days. Denies injury. Denies redness and swelling to the area. Pt would also like to be checked for STD. Pt c/o white discharge and burning. A&ox4 and ambulatory.

## 2016-07-01 ENCOUNTER — Emergency Department (HOSPITAL_BASED_OUTPATIENT_CLINIC_OR_DEPARTMENT_OTHER)
Admission: EM | Admit: 2016-07-01 | Discharge: 2016-07-01 | Disposition: A | Discharge status: Home / Self Care | Payer: 59 | Attending: Emergency Medicine | Admitting: Emergency Medicine

## 2016-07-01 ENCOUNTER — Encounter (HOSPITAL_BASED_OUTPATIENT_CLINIC_OR_DEPARTMENT_OTHER): Payer: Self-pay | Admitting: *Deleted

## 2016-07-01 DIAGNOSIS — F17210 Nicotine dependence, cigarettes, uncomplicated: Secondary | ICD-10-CM | POA: Insufficient documentation

## 2016-07-01 DIAGNOSIS — N760 Acute vaginitis: Secondary | ICD-10-CM | POA: Insufficient documentation

## 2016-07-01 DIAGNOSIS — M25461 Effusion, right knee: Secondary | ICD-10-CM | POA: Insufficient documentation

## 2016-07-01 DIAGNOSIS — Z202 Contact with and (suspected) exposure to infections with a predominantly sexual mode of transmission: Secondary | ICD-10-CM

## 2016-07-01 DIAGNOSIS — I10 Essential (primary) hypertension: Secondary | ICD-10-CM | POA: Insufficient documentation

## 2016-07-01 DIAGNOSIS — N898 Other specified noninflammatory disorders of vagina: Secondary | ICD-10-CM | POA: Insufficient documentation

## 2016-07-01 DIAGNOSIS — B9689 Other specified bacterial agents as the cause of diseases classified elsewhere: Secondary | ICD-10-CM

## 2016-07-01 HISTORY — DX: Cocaine abuse, uncomplicated: F14.10

## 2016-07-01 LAB — URINALYSIS, ROUTINE W REFLEX MICROSCOPIC
BILIRUBIN URINE: NEGATIVE
GLUCOSE, UA: NEGATIVE mg/dL
HGB URINE DIPSTICK: NEGATIVE
KETONES UR: 15 mg/dL — AB
Leukocytes, UA: NEGATIVE
Nitrite: NEGATIVE
PH: 5.5 (ref 5.0–8.0)
PROTEIN: NEGATIVE mg/dL
Specific Gravity, Urine: 1.024 (ref 1.005–1.030)

## 2016-07-01 LAB — WET PREP, GENITAL
SPERM: NONE SEEN
Trich, Wet Prep: NONE SEEN
Yeast Wet Prep HPF POC: NONE SEEN

## 2016-07-01 LAB — HCG, QUANTITATIVE, PREGNANCY: hCG, Beta Chain, Quant, S: 1 m[IU]/mL (ref <5)

## 2016-07-01 MED ORDER — AZITHROMYCIN 250 MG PO TABS
1000.0000 mg | ORAL_TABLET | Freq: Once | ORAL | Status: AC
  Administered 2016-07-01: 1000 mg via ORAL
  Filled 2016-07-01: qty 4

## 2016-07-01 MED ORDER — CEFTRIAXONE SODIUM 250 MG IJ SOLR
250.0000 mg | Freq: Once | INTRAMUSCULAR | Status: AC
  Administered 2016-07-01: 250 mg via INTRAMUSCULAR
  Filled 2016-07-01: qty 250

## 2016-07-01 MED ORDER — METRONIDAZOLE 500 MG PO TABS: 500.0000 mg | ORAL_TABLET | Freq: Two times a day (BID) | ORAL | 0 refills | Status: AC

## 2016-07-01 MED ORDER — PENICILLIN G BENZATHINE 1200000 UNIT/2ML IM SUSP
2.4000 10*6.[IU] | Freq: Once | INTRAMUSCULAR | Status: AC
  Administered 2016-07-01: 2.4 10*6.[IU] via INTRAMUSCULAR
  Filled 2016-07-01: qty 4

## 2016-07-01 NOTE — Discharge Instructions (Signed)
You have been tested and treated for potential sexually transmitted disease.  Please return if your symptoms worsen or if you have other concerns.  Notify sexual partner if your test came back positive for infection.  We will notify you if your tests are positive for infection. Wear knee wrap for support. Followup with orthopedist for further care.

## 2016-07-01 NOTE — ED Provider Notes (Signed)
[facility] Provider Note   CSN: [id number] Arrival date & time: 07/01/16  1816  First Provider Contact:   First MD Initiated Contact with Patient 07/01/16 1945      By signing my name below, I, Soijett Blue, attest that this documentation has been prepared under the direction and in the presence of Fayrene Helper, PA-C Electronically Signed: Soijett Blue, ED Scribe. 07/01/16. 7:55 PM.   History   Chief Complaint Chief Complaint  Patient presents with  . Exposure to STD    HPI Brittany Duarte is a 40 y.o. female with a medical hx of syphillis who presents to the Emergency Department complaining of exposure to STD onset 3 days. Pt notes that she thinks that she may have syphilis due to noticing a sore to her sexual partner penis following sex. Pt states that her last sexual intercourse was 3 days ago. Pt reports that she has had 2 sexual partners in the past 6 months with intermittent condom use. Pt notes that she has a past hx of syphilis that she was diagnosed with at age 81. She states that she is having associated symptoms of vaginal discharge and dysuria. She states that she has not tried any medications for the relief for her symptoms. She denies vaginal bleeding, back pain, abdominal pain, fever, vomiting, and any other symptoms. Pt denies pregnancy at this time due to having a tubal ligation in the past.  Pt reports that she is a patient at [facility] for cocaine and she has been drug free for one day.   Pt secondarily complains of right knee swelling onset 5 days. Pt rates her right knee pain as 9/10, soreness sensation, and is non-radiating. Pt states that movement worsens her right knee pain. Denies alleviating factors. Denies any past injury or trauma. Pt is having associated symptoms of right knee pain and gait problem due to pain. She notes that she has not tried any medications for the of her symptoms. She denies color change, wound, rash, and any other symptoms. Denies  allergies to medications.    The history is provided by the patient. No language interpreter was used.    Past Medical History:  Diagnosis Date  . Hep C w/o coma, chronic ([facility])   . Hypertension   . Syphilis   . Tuberculosis of lung, nodular 1999    Patient Active Problem List   Diagnosis Date Noted  . MDD (major depressive disorder), recurrent severe, without psychosis ([facility]) 06/12/2016  . Cocaine dependence ([facility]) 01/15/2016  . 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 Cocaine abuse   . Pain in lower limb 07/13/2014  . Metatarsalgia of right foot 07/08/2014  . Pronation deformity of ankle, acquired 07/08/2014  . Metatarsal deformity 07/08/2014    Past Surgical History:  Procedure Laterality Date  . APPENDECTOMY    . EXAMINATION UNDER ANESTHESIA  12/15/2011   Procedure: EXAM UNDER ANESTHESIA;  Surgeon: Rulon Abide, DO;  Location: [facility];  Service: General;  Laterality: N/A;  incison and drainage of peri rectal abcess  . RECTAL SURGERY    . TUBAL LIGATION      OB History    Gravida Para Term Preterm AB Living   3 3 3          SAB TAB Ectopic Multiple Live Births                   Home Medications    Prior to Admission medications   Medication Sig Start Date  End Date Taking? Authorizing Provider  hydrochlorothiazide (HYDRODIURIL) 25 MG tablet Take 1 tablet (25 mg total) by mouth daily. 01/29/16   Marlis Edelson, CNM    Family History No family history on file.  Social History Social History  Substance Use Topics  . Smoking status: Current Some Day Smoker    Packs/day: 0.50    Years: 27.00    Types: Cigarettes  . Smokeless tobacco: Never Used  . Alcohol use 0.0 oz/week     Comment: binge drinking     Allergies   Tylenol [acetaminophen]   Review of Systems Review of Systems  Constitutional: Negative for fever.  Gastrointestinal: Negative for vomiting.  Genitourinary: Positive for vaginal discharge. Negative for dysuria, hematuria,  pelvic pain, vaginal bleeding and vaginal pain.  Musculoskeletal: Positive for arthralgias (right knee), gait problem (due to pain) and joint swelling (right knee). Negative for back pain.  Skin: Negative for color change, rash and wound.     Physical Exam Updated Vital Signs BP (!) 161/121 (BP Location: Right Arm)   Pulse 66   Temp 98.1 F (36.7 C) (Oral)   Ht 6' (1.829 m)   Wt 243 lb (110.2 kg)   LMP 06/12/2016 (Exact Date)   SpO2 100%   BMI 32.96 kg/m   Physical Exam  Constitutional: She is oriented to person, place, and time. She appears well-developed and well-nourished. No distress.  HENT:  Head: Normocephalic and atraumatic.  Eyes: EOM are normal.  Neck: Neck supple.  Cardiovascular: Normal rate, regular rhythm and normal heart sounds.  Exam reveals no gallop and no friction rub.   No murmur heard. Pulmonary/Chest: Effort normal and breath sounds normal. No respiratory distress. She has no wheezes. She has no rales.  Abdominal: Soft. She exhibits no distension. There is no tenderness. Hernia confirmed negative in the right inguinal area and confirmed negative in the left inguinal area.  Genitourinary: Vagina normal. There is no tenderness or lesion on the right labia. There is no tenderness or lesion on the left labia. Cervix exhibits no motion tenderness. Right adnexum displays no tenderness. Left adnexum displays no tenderness. No tenderness or bleeding in the vagina.  Genitourinary Comments: Chaperone present for exam. No inguinal LAD. No inguinal hernia. Nl external genitalia. Free of lesion or rash. No pain with speculum insertion. Nl vaginal vault. Mild functional vaginal discharge. No vaginal bleeding. Closed cervical os free of lesion or rash. On bimanual exam there was no adnexal tenderness or CMT.  Musculoskeletal: Normal range of motion.       Right knee: She exhibits swelling. Tenderness found.  Tenderness to the anterior aspect of right knee with associated  swelling. No overlying skin changes. Patella is located. Negative anterior and posterior drawer test.   Lymphadenopathy:       Right: No inguinal adenopathy present.       Left: No inguinal adenopathy present.  Neurological: She is alert and oriented to person, place, and time.  Skin: Skin is warm and dry.  Psychiatric: She has a normal mood and affect. Her behavior is normal.  Nursing note and vitals reviewed.    ED Treatments / Results  DIAGNOSTIC STUDIES: Oxygen Saturation is 100% on RA, nl by my interpretation.    COORDINATION OF CARE: 7:53 PM Discussed treatment plan with pt at bedside which includes UA, wet prep, HIV antibody, RPR, GC/Chlamydia probe, and referral and follow up to orthopedist, and pt agreed to plan.   Labs (all labs ordered are listed, but only  abnormal results are displayed) Labs Reviewed  WET PREP, GENITAL - Abnormal; Notable for the following:       Result Value   Clue Cells Wet Prep HPF POC PRESENT (*)    WBC, Wet Prep HPF POC MODERATE (*)    All other components within normal limits  URINALYSIS, ROUTINE W REFLEX MICROSCOPIC (NOT AT [facility]) - Abnormal; Notable for the following:    APPearance CLOUDY (*)    Ketones, ur 15 (*)    All other components within normal limits  HCG, QUANTITATIVE, PREGNANCY  HIV ANTIBODY (ROUTINE TESTING)  RPR  GC/CHLAMYDIA PROBE AMP ([place]) NOT AT [facility]    EKG  EKG Interpretation None       Radiology No results found.  Procedures Procedures (including critical care time)  Medications Ordered in ED Medications - No data to display   Initial Impression / Assessment and Plan / ED Course  I have reviewed the triage vital signs and the nursing notes.  Pertinent labs & imaging results that were available during my care of the patient were reviewed by me and considered in my medical decision making (see chart for details).  Clinical Course    BP (!) 153/101 (BP Location: Right Arm)   Pulse (!) 57   Temp  98.1 F (36.7 C) (Oral)   Resp 18   Ht 6' (1.829 m)   Wt 110.2 kg   LMP 06/12/2016 (Exact Date)   SpO2 100%   BMI 32.96 kg/m    Final Clinical Impressions(s) / ED Diagnoses   Final diagnoses:  Possible exposure to STD  BV (bacterial vaginosis)  Effusion of right knee    New Prescriptions New Prescriptions   METRONIDAZOLE (FLAGYL) 500 MG TABLET    Take 1 tablet (500 mg total) by mouth 2 (two) times daily. One po bid x 7 days    I personally performed the services described in this documentation, which was scribed in my presence. The recorded information has been reviewed and is accurate.   9:56 PM Pt here with concern for STD.  Wet prep did show evidence concerning for BV and potential STD.  Since pt sts she may have seen an ulcerated lesion on her sexual partner's penis and she voice concern for syphilis, I did offer option of waiting for her RPR to result before treatment.  Pt prefers treatment, as well as treatment for potential GC/Ch.  Pt also has R knee pain and effusion. No injury.  Her R knee is swollen, does not appear infection.  ACE wrap applied.  Ortho referral given.      Fayrene Helper, PA-C 07/01/16 2202    Rolland Porter, MD 07/13/16 [phone]

## 2016-07-01 NOTE — ED Triage Notes (Signed)
Swelling to her right knee. Was having sex and the condom broke and she has not felt the same since. She thinks she was exposed to an STD. She is a patient at [facility]. Unable to locate pt on first attempt to triage her. She was down the hall by the pharmacy entrance and then made a phone call on the waiting room phone.

## 2016-07-02 LAB — GC/CHLAMYDIA PROBE AMP (~~LOC~~) NOT AT ARMC
Chlamydia: NEGATIVE
Neisseria Gonorrhea: NEGATIVE

## 2016-07-03 LAB — RPR, QUANT+TP ABS (REFLEX)
Rapid Plasma Reagin, Quant: 1:2 {titer} — ABNORMAL HIGH
T Pallidum Abs: POSITIVE — AB

## 2016-07-03 LAB — HIV ANTIBODY (ROUTINE TESTING W REFLEX): HIV SCREEN 4TH GENERATION: NONREACTIVE

## 2016-07-03 LAB — RPR: RPR: REACTIVE — AB

## 2016-07-04 ENCOUNTER — Telehealth (HOSPITAL_BASED_OUTPATIENT_CLINIC_OR_DEPARTMENT_OTHER): Payer: Self-pay | Admitting: Emergency Medicine

## 2016-07-04 ENCOUNTER — Telehealth (HOSPITAL_BASED_OUTPATIENT_CLINIC_OR_DEPARTMENT_OTHER): Payer: Self-pay | Admitting: *Deleted

## 2016-07-04 NOTE — Telephone Encounter (Signed)
Patient positive for STD (RPR reactive) was treated with Rocephin and Azithromycin in the ED 07/01/2016. Will send a letter to patient since no answer and invalid number.

## 2016-07-04 NOTE — Telephone Encounter (Deleted)
Chart handoff to EDP for treatment plan for + RPRPost ED Visit - Positive Culture Follow-up  Culture report reviewed by antimicrobial stewardship pharmacist:  []  Enzo Bi, Pharm.D. []  Celedonio Miyamoto, [address].D., BCPS []  Garvin Fila, Pharm.D. []  Georgina Pillion, Pharm.D., BCPS []  [place], [address].D., BCPS, AAHIVP []  Estella Husk, Pharm.D., BCPS, AAHIVP []  Tennis Must, [address].D. []  Rob Oswaldo Done, [address].D.  Positive *** culture Treated with ***, organism sensitive to the same and no further patient follow-up is required at this time.  Berle Mull 07/04/2016, 10:33 AM  Post ED Visit - Positive Culture Follow-up: Successful Patient Follow-Up  Culture assessed and recommendations reviewed by: []  Enzo Bi, Pharm.D. []  Celedonio Miyamoto, [address].D., BCPS []  Garvin Fila, Pharm.D. []  Georgina Pillion, Pharm.D., BCPS []  [place], [address].D., BCPS, AAHIVP []  Estella Husk, Pharm.D., BCPS, AAHIVP []  Tennis Must, Pharm.D. []  Sherle Poe, [address].D.  Positive *** culture  []  Patient discharged without antimicrobial prescription and treatment is now indicated []  Organism is resistant to prescribed ED discharge antimicrobial []  Patient with positive blood cultures  Changes discussed with ED provider: *** New antibiotic prescription *** Called to ***  Contacted patient, date ***, time ***   Berle Mull 07/04/2016, 10:34 AM   Post ED Visit - Positive Culture Follow-up: Successful Patient Follow-Up  Culture assessed and recommendations reviewed by: []  Enzo Bi, Pharm.D. []  Celedonio Miyamoto, Pharm.D., BCPS []  Garvin Fila, Pharm.D. []  Georgina Pillion, Pharm.D., BCPS []  [place], [place].D., BCPS, AAHIVP []  Estella Husk, Pharm.D., BCPS, AAHIVP []  Tennis Must, Pharm.D. []  Sherle Poe, [address].D.  Positive *** culture  []  Patient discharged without antimicrobial prescription and treatment is now indicated []  Organism is resistant to prescribed ED discharge  antimicrobial []  Patient with positive blood cultures  Changes discussed with ED provider: *** New antibiotic prescription *** Called to ***  Contacted patient, date ***, time ***   Berle Mull 07/04/2016, 10:34 AM

## 2018-01-14 IMAGING — CR DG KNEE COMPLETE 4+V*R*
4 series · 4 of 4 positions shown · non-contrast
Comparison: None.

CLINICAL DATA: Right knee pain, no known injury, initial encounter

EXAM:
RIGHT KNEE - COMPLETE 4+ VIEW

[t knee ap right]
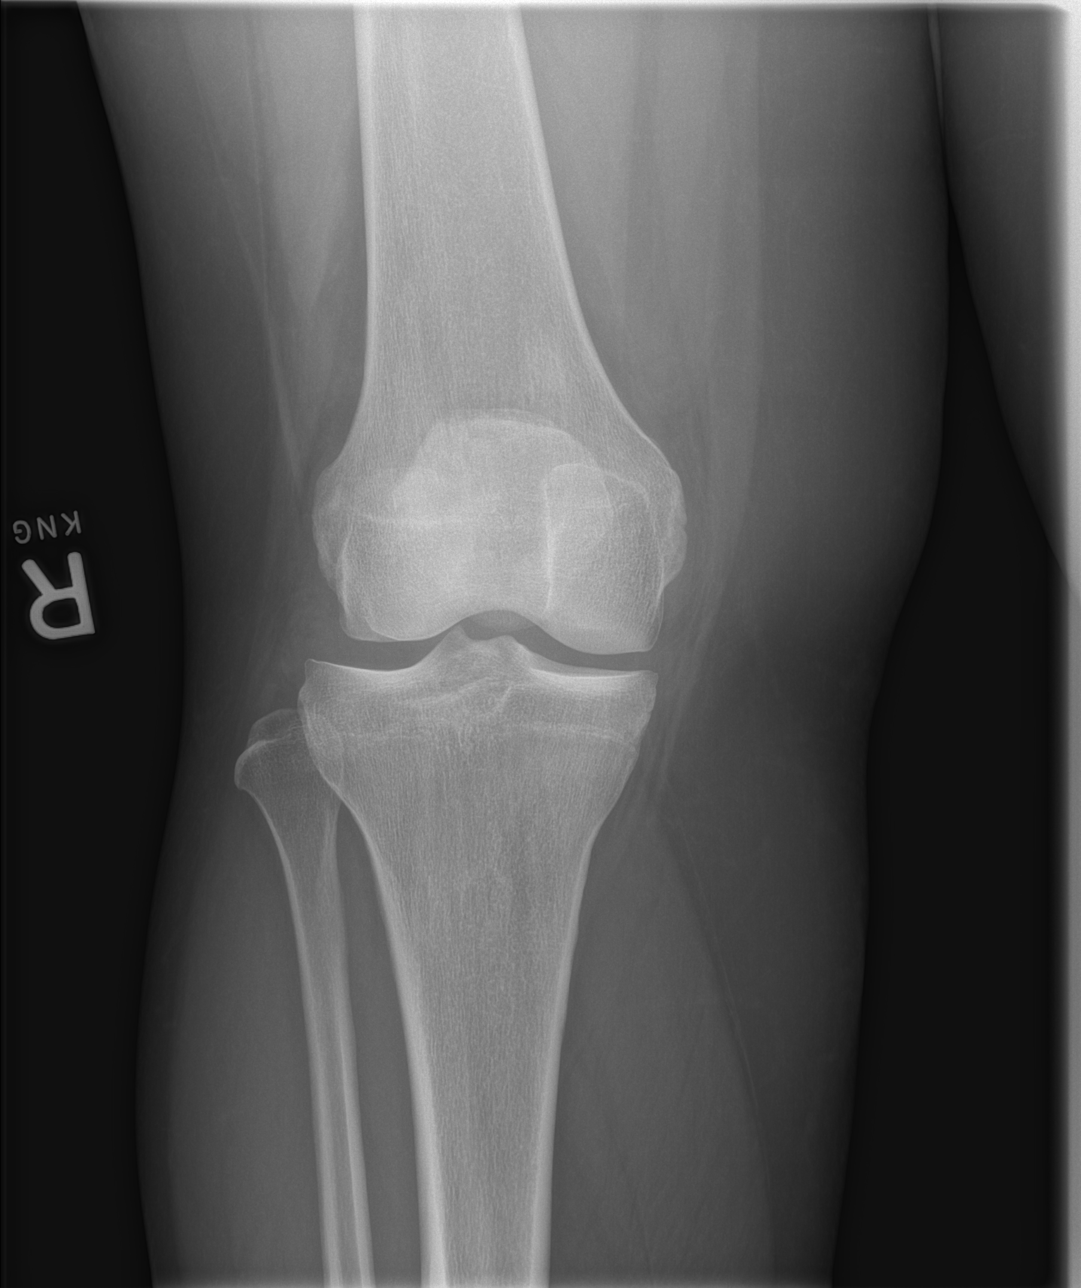

[t knee obl right (1 of 2)]
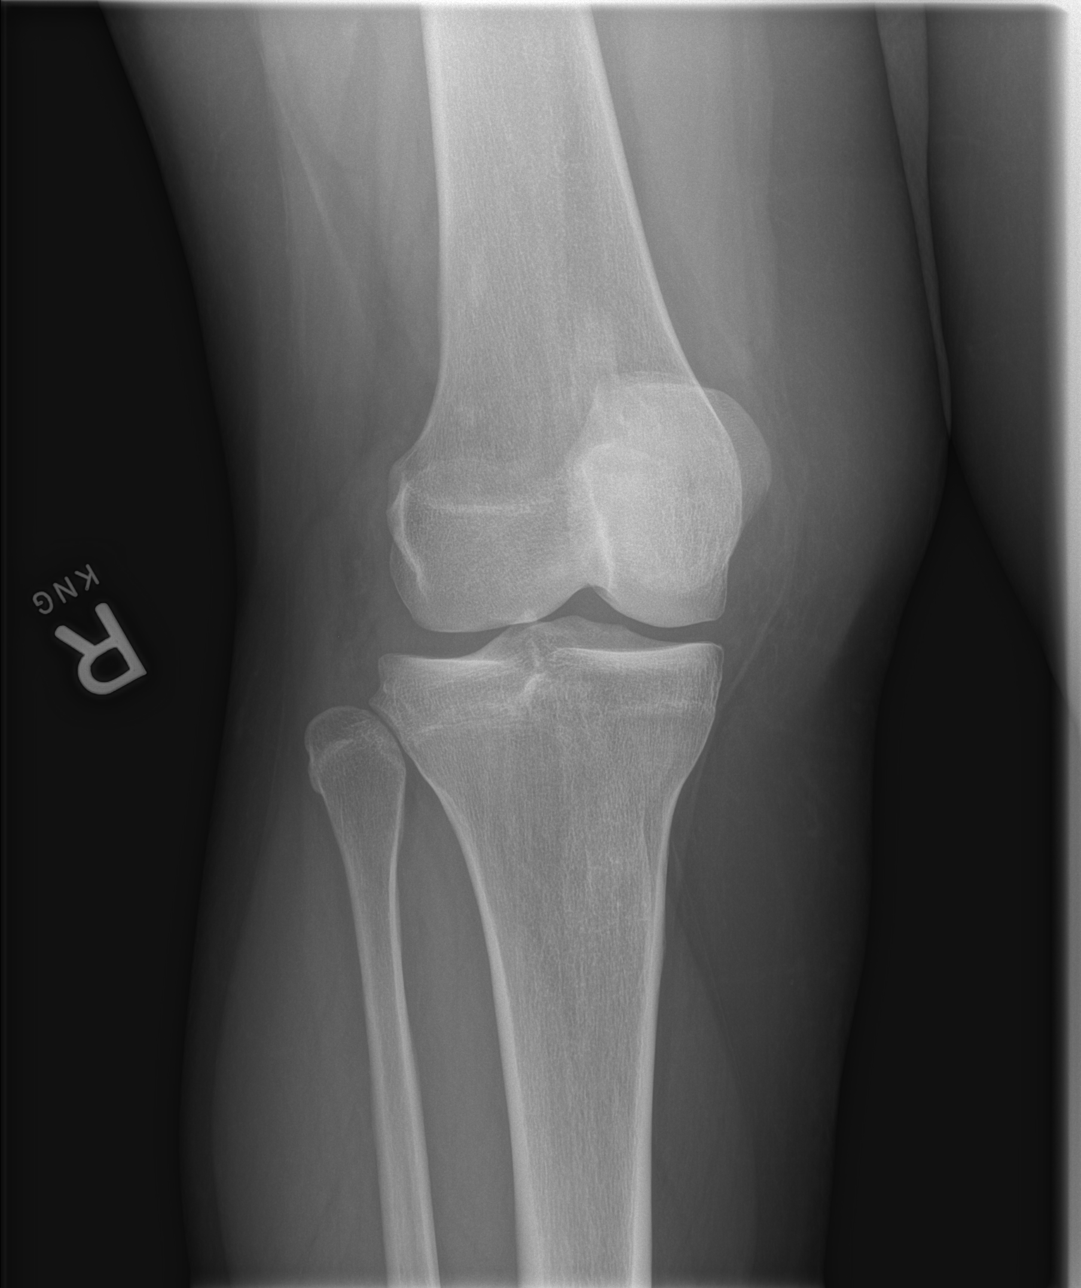

[t knee obl right (2 of 2)]
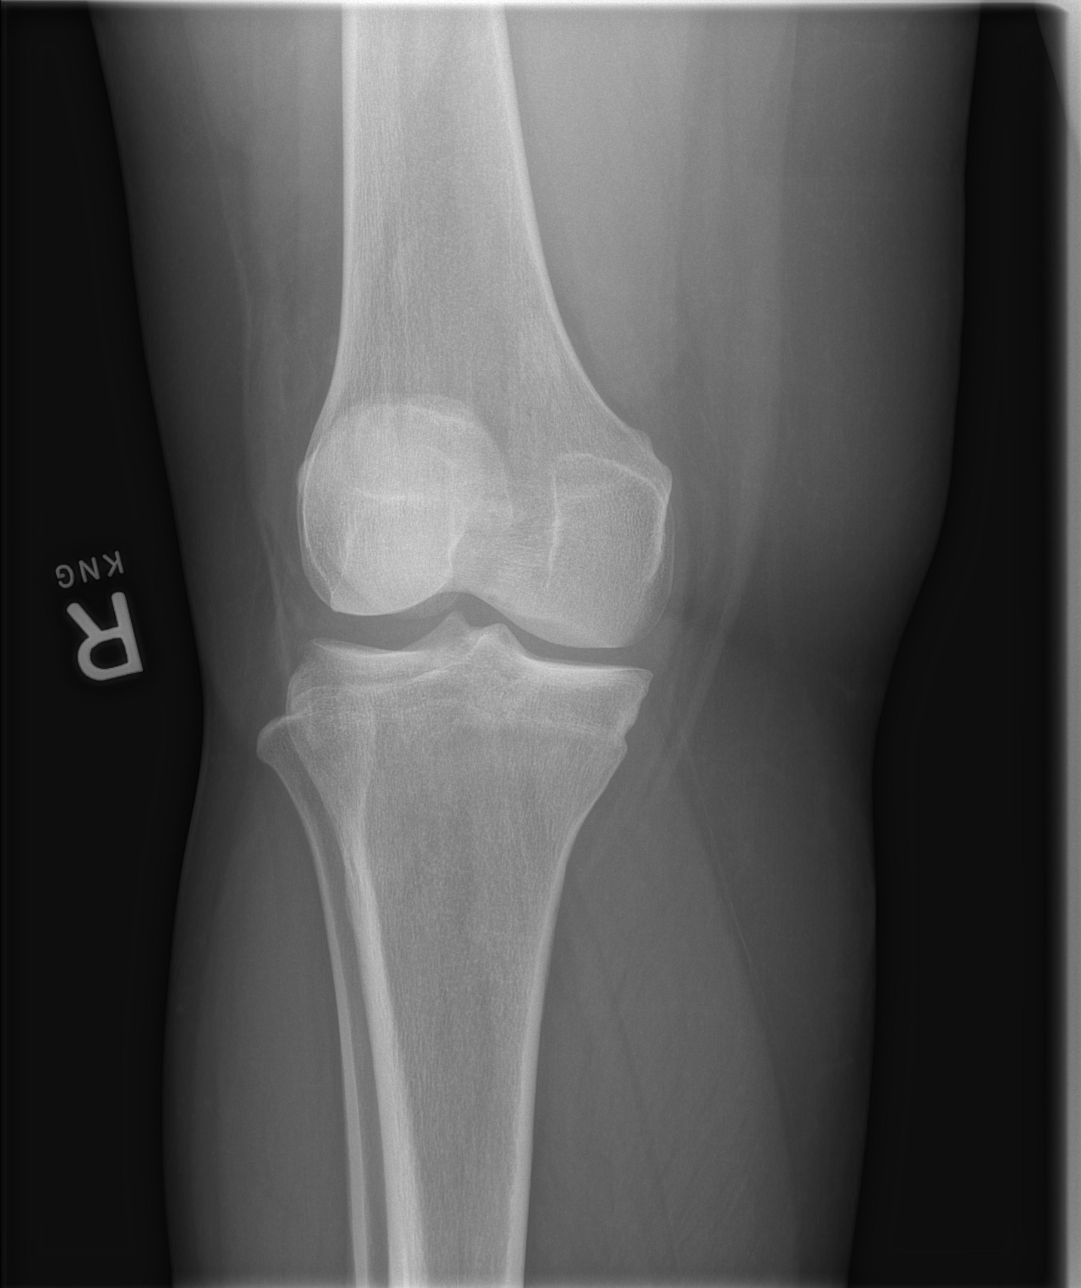

[t knee lat right]
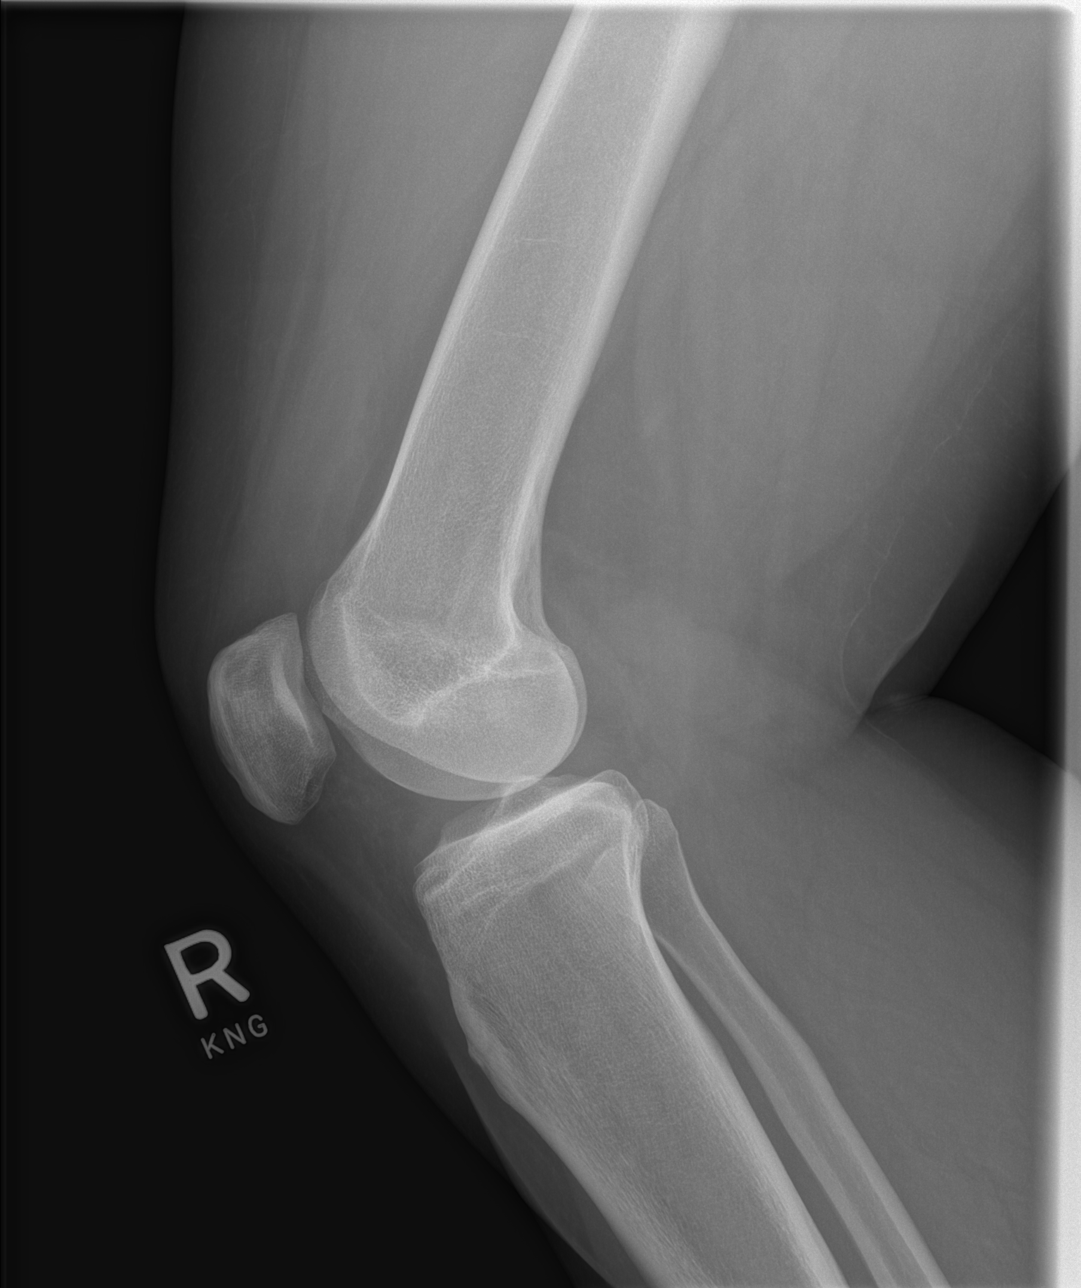

[4 of 4 positions shown; findings below may reference images not displayed]

FINDINGS: No evidence of fracture, dislocation, or joint effusion. No evidence
of arthropathy or other focal bone abnormality. Soft tissues are
unremarkable.
IMPRESSION: No acute abnormality noted.

## 2024-02-03 ENCOUNTER — Other Ambulatory Visit: Payer: Self-pay | Admitting: Physician Assistant

## 2024-02-03 DIAGNOSIS — Z1231 Encounter for screening mammogram for malignant neoplasm of breast: Secondary | ICD-10-CM
